# Patient Record
Sex: Female | Born: 1935 | Race: White | Hispanic: No | Marital: Married | Smoking: Former smoker
Health system: Southern US, Community
[De-identification: ages and names within clinical notes are randomized; demographics above are authoritative.]

## PROBLEM LIST (undated history)

## (undated) DIAGNOSIS — E785 Hyperlipidemia, unspecified: Secondary | ICD-10-CM

## (undated) DIAGNOSIS — F0390 Unspecified dementia without behavioral disturbance: Secondary | ICD-10-CM

## (undated) DIAGNOSIS — IMO0001 Reserved for inherently not codable concepts without codable children: Secondary | ICD-10-CM

## (undated) DIAGNOSIS — F329 Major depressive disorder, single episode, unspecified: Secondary | ICD-10-CM

## (undated) DIAGNOSIS — F32A Depression, unspecified: Secondary | ICD-10-CM

## (undated) DIAGNOSIS — R002 Palpitations: Secondary | ICD-10-CM

## (undated) DIAGNOSIS — I10 Essential (primary) hypertension: Secondary | ICD-10-CM

## (undated) DIAGNOSIS — E7800 Pure hypercholesterolemia, unspecified: Secondary | ICD-10-CM

## (undated) DIAGNOSIS — R413 Other amnesia: Secondary | ICD-10-CM

## (undated) DIAGNOSIS — R51 Headache: Secondary | ICD-10-CM

## (undated) HISTORY — DX: Pure hypercholesterolemia, unspecified: E78.00

## (undated) HISTORY — DX: Major depressive disorder, single episode, unspecified: F32.9

## (undated) HISTORY — DX: Reserved for inherently not codable concepts without codable children: IMO0001

## (undated) HISTORY — DX: Palpitations: R00.2

## (undated) HISTORY — DX: Other amnesia: R41.3

## (undated) HISTORY — DX: Headache: R51

## (undated) HISTORY — DX: Hyperlipidemia, unspecified: E78.5

## (undated) HISTORY — DX: Unspecified dementia, unspecified severity, without behavioral disturbance, psychotic disturbance, mood disturbance, and anxiety: F03.90

## (undated) HISTORY — DX: Depression, unspecified: F32.A

## (undated) HISTORY — DX: Essential (primary) hypertension: I10

## (undated) HISTORY — PX: OTHER SURGICAL HISTORY: SHX169

## (undated) HISTORY — PX: SKIN LESION EXCISION: SHX2412

## (undated) HISTORY — PX: ABDOMINAL HYSTERECTOMY: SHX81

---

## 1998-10-14 ENCOUNTER — Ambulatory Visit (HOSPITAL_COMMUNITY): Admission: RE | Admit: 1998-10-14 | Discharge: 1998-10-14 | Payer: Self-pay | Admitting: Obstetrics & Gynecology

## 1998-11-03 ENCOUNTER — Other Ambulatory Visit: Admission: RE | Admit: 1998-11-03 | Discharge: 1998-11-03 | Payer: Self-pay | Admitting: Plastic Surgery

## 2003-08-07 DIAGNOSIS — IMO0001 Reserved for inherently not codable concepts without codable children: Secondary | ICD-10-CM

## 2003-08-07 HISTORY — DX: Reserved for inherently not codable concepts without codable children: IMO0001

## 2006-03-08 ENCOUNTER — Encounter: Admission: RE | Admit: 2006-03-08 | Discharge: 2006-03-08 | Payer: Self-pay | Admitting: Cardiology

## 2006-06-25 ENCOUNTER — Encounter: Admission: RE | Admit: 2006-06-25 | Discharge: 2006-06-25 | Payer: Self-pay | Admitting: Orthopedic Surgery

## 2007-07-21 ENCOUNTER — Encounter: Admission: RE | Admit: 2007-07-21 | Discharge: 2007-08-06 | Payer: Self-pay | Admitting: Family Medicine

## 2007-08-11 ENCOUNTER — Encounter: Admission: RE | Admit: 2007-08-11 | Discharge: 2007-09-15 | Payer: Self-pay | Admitting: Family Medicine

## 2007-09-24 ENCOUNTER — Encounter: Admission: RE | Admit: 2007-09-24 | Discharge: 2007-11-24 | Payer: Self-pay | Admitting: Orthopedic Surgery

## 2008-03-08 ENCOUNTER — Encounter: Admission: RE | Admit: 2008-03-08 | Discharge: 2008-03-18 | Payer: Self-pay | Admitting: Orthopedic Surgery

## 2009-06-26 ENCOUNTER — Encounter: Admission: RE | Admit: 2009-06-26 | Discharge: 2009-06-26 | Payer: Self-pay | Admitting: Neurology

## 2010-04-13 ENCOUNTER — Ambulatory Visit: Payer: Self-pay | Admitting: Cardiology

## 2010-04-13 ENCOUNTER — Encounter: Admission: RE | Admit: 2010-04-13 | Discharge: 2010-04-13 | Payer: Self-pay | Admitting: Cardiology

## 2010-04-14 ENCOUNTER — Encounter: Admission: RE | Admit: 2010-04-14 | Discharge: 2010-04-14 | Payer: Self-pay | Admitting: Cardiology

## 2010-06-26 ENCOUNTER — Ambulatory Visit: Payer: Self-pay | Admitting: Cardiology

## 2010-06-27 ENCOUNTER — Ambulatory Visit: Payer: Self-pay | Admitting: Cardiology

## 2010-08-30 ENCOUNTER — Encounter: Payer: Self-pay | Admitting: Cardiology

## 2010-08-30 DIAGNOSIS — F0390 Unspecified dementia without behavioral disturbance: Secondary | ICD-10-CM | POA: Insufficient documentation

## 2010-08-30 DIAGNOSIS — I10 Essential (primary) hypertension: Secondary | ICD-10-CM | POA: Insufficient documentation

## 2010-08-30 DIAGNOSIS — E7800 Pure hypercholesterolemia, unspecified: Secondary | ICD-10-CM | POA: Insufficient documentation

## 2010-08-30 DIAGNOSIS — E785 Hyperlipidemia, unspecified: Secondary | ICD-10-CM | POA: Insufficient documentation

## 2010-10-26 ENCOUNTER — Ambulatory Visit (INDEPENDENT_AMBULATORY_CARE_PROVIDER_SITE_OTHER): Payer: Medicare Other | Admitting: Cardiology

## 2010-10-26 ENCOUNTER — Encounter: Payer: Self-pay | Admitting: Cardiology

## 2010-10-26 VITALS — BP 136/64 | HR 64 | Wt 176.0 lb

## 2010-10-26 DIAGNOSIS — E785 Hyperlipidemia, unspecified: Secondary | ICD-10-CM

## 2010-10-26 DIAGNOSIS — I119 Hypertensive heart disease without heart failure: Secondary | ICD-10-CM

## 2010-10-26 DIAGNOSIS — I10 Essential (primary) hypertension: Secondary | ICD-10-CM

## 2010-10-26 DIAGNOSIS — F0390 Unspecified dementia without behavioral disturbance: Secondary | ICD-10-CM

## 2010-10-26 DIAGNOSIS — E7800 Pure hypercholesterolemia, unspecified: Secondary | ICD-10-CM

## 2010-10-26 LAB — LIPID PANEL
Cholesterol: 199 mg/dL (ref 0–200)
HDL: 79 mg/dL (ref >39)
LDL Cholesterol: 101 mg/dL — ABNORMAL HIGH (ref 0–99)
VLDL: 19 mg/dL (ref 0–40)

## 2010-10-26 LAB — COMPREHENSIVE METABOLIC PANEL
ALT: 12 U/L (ref 0–35)
AST: 18 U/L (ref 0–37)
Albumin: 4.2 g/dL (ref 3.5–5.2)
Calcium: 9.1 mg/dL (ref 8.4–10.5)
Chloride: 96 mEq/L (ref 96–112)
Total Protein: 6.7 g/dL (ref 6.0–8.3)

## 2010-10-26 NOTE — Assessment & Plan Note (Signed)
No new medications prescribed today.  She will continue her regular followup appointments with her neurologist.  We will not restart her propranolol at this time.

## 2010-10-26 NOTE — Progress Notes (Signed)
HPI: Frequent headaches.  Especially if hungry.  Memory possibly better.  Sleeping better with ambien. Thinks she may be having low bloodsugar at times.Sometimes 30 minutes after meals she will notice a transitory pounding in the left side of her head.  She is unable to state whether this is worse and she is no longer on the propranolol.  Her neurologist took her off the propranolol possibly because of her history of dementia.  She has not been experiencing any exertional chest pain or angina.  She is continuing to exercise regularly to try to lose weight.  Current Outpatient Prescriptions  Medication Sig Dispense Refill  . Ascorbic Acid (VITAMIN C) 500 MG tablet Take 500 mg by mouth daily.        [name] aspirin 325 MG tablet Take 325 mg by mouth daily.        [name] atorvastatin (LIPITOR) 20 MG tablet Take 20 mg by mouth daily.        [name] b complex vitamins tablet Take 1 tablet by mouth daily.        . Bumetanide (BUMEX PO) Take 1 mg by mouth daily. 1/2 DAILY       . candesartan-hydrochlorothiazide (ATACAND HCT) 32-12.5 MG per tablet Take 1 tablet by mouth daily.        . Cholecalciferol (VITAMIN D) 2000 UNIT tablet Take 2,000 Units by mouth daily.        [name] donepezil (ARICEPT) 10 MG tablet Take 10 mg by mouth daily.        [name] gabapentin (NEURONTIN) 300 MG capsule Take 300 mg by mouth at bedtime.        . MULTIPLE VITAMIN PO Take by mouth daily.        [name] olmesartan-hydrochlorothiazide (BENICAR HCT) 40-12.5 MG per tablet Take 1 tablet by mouth daily.        . vitamin A 66063 UNIT capsule Take 10,000 Units by mouth daily.        [name] zolpidem (AMBIEN) 10 MG tablet Take 10 mg by mouth at bedtime.        . propranolol (INDERAL) 20 MG tablet Take 20 mg by mouth as needed.          Allergies  Allergen Reactions  . Mevacor (Lovastatin)     Leg cramps  . Toprol Xl (Metoprolol Succinate) Palpitations    Patient Active Problem List  Diagnoses  . Hypertension  . Hypercholesteremia  . Headache  . Dyslipidemia    . Dementia    History  Smoking status  . Former Smoker  Smokeless tobacco  . Not on file    History  Alcohol Use  . 0.0 oz/week  . 1-3 Glasses of wine per week    Family History  Problem Relation Age of Onset  . Parkinsonism Mother   . Heart disease Father     Review of Systems: The patient denies any heat or cold intolerance.  No weight gain or weight loss.  The patient denies headaches or blurry vision.  There is no cough or sputum production.  The patient denies dizziness.  There is no hematuria or hematochezia.  The patient denies any muscle aches or arthritis.  The patient denies any rash.  The patient denies frequent falling or instability. All other systems were reviewed and are negative.   Physical Exam: Vital signs as recorded.  Blood pressure is satisfactory.  The general appearance reveals a well-developed well-nourished woman in no acute distress.Pupils equal and reactive.   Extraocular Movements  are full.  There is no scleral icterus.  The mouth and pharynx are normal.  The neck is supple.  The carotids reveal no bruits.  The jugular venous pressure is normal.  The thyroid is not enlarged.  There is no lymphadenopathy.The chest is clear to percussion and auscultation. There are no rales or rhonchi. Expansion of the chest is symmetrical.The precordium is quiet.  The first heart sound is normal.  The second heart sound is physiologically split.  There is no murmur gallop rub or click.  There is no abnormal lift or heave.The abdomen is soft and nontender. Bowel sounds are normal. The liver and spleen are not enlarged. There Are no abdominal masses. There are no bruits.The pedal pulses are good.  There is no phlebitis or edema.  There is no cyanosis or clubbing.Strength is normal and symmetrical in all extremities.  There is no lateralizing weakness.  There are no sensory deficits.The skin is warm and dry.  There is no rash.    Assessment / Plan:

## 2010-10-26 NOTE — Assessment & Plan Note (Signed)
The patient is trying to adhere to a careful low-cholesterol diet.  To avoid hypoglycemia she will avoid free sweets and to try to maintain a high routine diet intake.Fasting lab work today is pending.

## 2010-10-26 NOTE — Assessment & Plan Note (Signed)
Her blood pressure remains satisfactory on present medication.  Continue same meds.

## 2010-11-06 ENCOUNTER — Encounter: Payer: Self-pay | Admitting: *Deleted

## 2010-12-06 ENCOUNTER — Telehealth: Payer: Self-pay | Admitting: Cardiology

## 2010-12-06 NOTE — Telephone Encounter (Signed)
Wakes up in the middle of the night with the shakes and thinks it is coming from low blood sugar.  States she has had episodes for years. States her symptoms for last 3 days are weak trembles, heart fluttering, no energy, and short of breath, which she says is like her low blood sugar episodes.  Thinks it is all related to low blood sugar has discussed in the past. Wants a glucose pill called to pharmacy.  Insisting that low blood sugar is the problem and that the pharmacist says there is a glucose pill.  Please advise.

## 2010-12-06 NOTE — Telephone Encounter (Signed)
I don't think that these are description drugs.  There may be an over-the-counter glucose pills that the pharmacy would be able to advise [place] about.  She can probably accomplish the same thing by drinking orange juice with sugar.

## 2010-12-06 NOTE — Telephone Encounter (Signed)
Wants to ask you a question about her "low blood sugar" and what kind of med.she should take for it.

## 2010-12-07 NOTE — Telephone Encounter (Signed)
Work in with Lawson Fiscal per [name]

## 2010-12-08 ENCOUNTER — Ambulatory Visit (INDEPENDENT_AMBULATORY_CARE_PROVIDER_SITE_OTHER): Payer: Medicare Other | Admitting: Nurse Practitioner

## 2010-12-08 ENCOUNTER — Encounter: Payer: Self-pay | Admitting: Nurse Practitioner

## 2010-12-08 ENCOUNTER — Telehealth: Payer: Self-pay | Admitting: *Deleted

## 2010-12-08 VITALS — BP 142/82 | HR 66 | Ht 64.0 in | Wt 175.4 lb

## 2010-12-08 DIAGNOSIS — R51 Headache: Secondary | ICD-10-CM

## 2010-12-08 DIAGNOSIS — F0390 Unspecified dementia without behavioral disturbance: Secondary | ICD-10-CM

## 2010-12-08 DIAGNOSIS — I10 Essential (primary) hypertension: Secondary | ICD-10-CM

## 2010-12-08 DIAGNOSIS — R002 Palpitations: Secondary | ICD-10-CM

## 2010-12-08 LAB — BASIC METABOLIC PANEL
BUN: 8 mg/dL (ref 6–23)
CO2: 30 mEq/L (ref 19–32)
Calcium: 9.1 mg/dL (ref 8.4–10.5)
Chloride: 100 mEq/L (ref 96–112)
Creatinine, Ser: 0.5 mg/dL (ref 0.4–1.2)
GFR: 127.04 mL/min (ref >60.00)
Glucose, Bld: 84 mg/dL (ref 70–99)
Potassium: 4.2 mEq/L (ref 3.5–5.1)
Sodium: 138 mEq/L (ref 135–145)

## 2010-12-08 LAB — TSH: TSH: 0.54 u[IU]/mL (ref 0.35–5.50)

## 2010-12-08 MED ORDER — PROPRANOLOL HCL 20 MG PO TABS: 20.0000 mg | ORAL_TABLET | Freq: Two times a day (BID) | ORAL | Status: DC

## 2010-12-08 NOTE — Assessment & Plan Note (Signed)
I would like to get her back on her Inderal. I have sent a new prescription to the drug store for the 20mg  to take BID. I will see her back in about 1 month. Her husband will monitor her blood pressure. Patient and husband are agreeable to this plan and will call if any problems develop in the interim.

## 2010-12-08 NOTE — Telephone Encounter (Signed)
Adv pt of lab results re- voicemail

## 2010-12-08 NOTE — Assessment & Plan Note (Signed)
Inderal has been added back. This may help.

## 2010-12-08 NOTE — Progress Notes (Signed)
Mary Harrison Date of Birth: 1931/02/24   History of Present Illness: Mary Harrison is seen today for a work in visit. She is seen for [name]. She is complaining of headaches, palpitations and weak spells. Yesterday was a "bad day". Most of the history is obtained from the husband. She has significant memory issues. She is not having chest pain. Her blood pressure is running higher at home. She is having palpitations. She can feel her heart pulsating in her throat. She is not dizzy. She is not having syncope. She is no longer taking her Inderal. Apparently Dr. Sandria Manly did not want to refill it. She did try a dose last night and had good results. She has not been taking this regularly for quite some time.   Current Outpatient Prescriptions on File Prior to Visit  Medication Sig Dispense Refill  . Ascorbic Acid (VITAMIN C) 500 MG tablet Take 500 mg by mouth daily.        [name] aspirin 325 MG tablet Take 325 mg by mouth daily.        [name] atorvastatin (LIPITOR) 20 MG tablet Take 20 mg by mouth daily.        [name] b complex vitamins tablet Take 1 tablet by mouth daily.        . Bumetanide (BUMEX PO) Take 1 mg by mouth daily. 1/2 DAILY       . Cholecalciferol (VITAMIN D) 2000 UNIT tablet Take 2,000 Units by mouth daily.        [name] donepezil (ARICEPT) 10 MG tablet Take 10 mg by mouth daily.        [name] gabapentin (NEURONTIN) 300 MG capsule Take 300 mg by mouth at bedtime.        . MULTIPLE VITAMIN PO Take by mouth daily.        [name] olmesartan-hydrochlorothiazide (BENICAR HCT) 40-12.5 MG per tablet Take 1 tablet by mouth daily.        [name] omeprazole (PRILOSEC) 40 MG capsule Take 40 mg by mouth daily.        . propranolol (INDERAL) 20 MG tablet Take 20 mg by mouth as needed.        . vitamin A 87564 UNIT capsule Take 10,000 Units by mouth daily.        [name] zolpidem (AMBIEN) 10 MG tablet Take 10 mg by mouth at bedtime as needed.       . candesartan-hydrochlorothiazide (ATACAND HCT) 32-12.5 MG per tablet Take 1 tablet by  mouth daily.          Allergies  Allergen Reactions  . Mevacor (Lovastatin)     Leg cramps  . Toprol Xl (Metoprolol Succinate) Palpitations    Past Medical History  Diagnosis Date  . Hypertension   . Hypercholesteremia   . Headache   . Dyslipidemia   . Dementia     Past Surgical History  Procedure Date  . Abdominal hysterectomy     History  Smoking status  . Former Smoker  . Quit date: 08/06/1968  Smokeless tobacco  . Never Used    History  Alcohol Use  . 0.0 oz/week  . 1-3 Glasses of wine per week    Family History  Problem Relation Age of Onset  . Parkinsonism Mother   . Heart disease Father     Review of Systems: The review of systems is positive for memory issues. She is dizzy and having headaches. Eating does not make these symptoms go away. She does not use  much caffeine.  All other systems were reviewed and are negative.  Physical Exam: BP 142/82  Pulse 66  Ht 5\' 4"  (1.626 m)  Wt 175 lb 6.4 oz (79.561 kg)  BMI 30.11 kg/m2 Patient is very pleasant and in no acute distress. She does have significant memory issues. She is obese. Skin is warm and dry. Color is normal.  HEENT is unremarkable. Normocephalic/atraumatic. PERRL. Sclera are nonicteric. Neck is supple. No masses. No JVD. Lungs are clear. Cardiac exam shows a regular rate and rhythm. She does have an occasional ectopic noted but in sinus rhythm. Abdomen is soft and obese. Extremities are without edema. Gait and ROM are intact. No gross neurologic deficits noted.  LABORATORY DATA:   PENDING   Assessment / Plan:'

## 2010-12-08 NOTE — Assessment & Plan Note (Signed)
Inderal is added back. This may help with her headaches.

## 2010-12-08 NOTE — Patient Instructions (Signed)
We are going to get you back on the Inderal. Take 20mg  2 x a day. I have sent a prescription to the drug store. Check your blood pressure and keep a diary. I will see her back in about 1 month We will check your thyroid levels today.

## 2010-12-08 NOTE — Assessment & Plan Note (Signed)
She is followed by Dr. Sandria Manly. She sees him on Monday for evaluation. She is on Aricept.

## 2010-12-12 ENCOUNTER — Encounter: Payer: Self-pay | Admitting: Cardiology

## 2011-01-05 ENCOUNTER — Ambulatory Visit: Payer: Medicare Other | Admitting: Nurse Practitioner

## 2011-02-22 ENCOUNTER — Other Ambulatory Visit: Payer: Self-pay | Admitting: Cardiology

## 2011-02-22 DIAGNOSIS — E785 Hyperlipidemia, unspecified: Secondary | ICD-10-CM

## 2011-02-22 NOTE — Telephone Encounter (Signed)
escribe request  

## 2011-03-01 ENCOUNTER — Other Ambulatory Visit: Payer: Self-pay | Admitting: Cardiology

## 2011-03-01 DIAGNOSIS — G629 Polyneuropathy, unspecified: Secondary | ICD-10-CM

## 2011-03-02 ENCOUNTER — Encounter: Payer: Self-pay | Admitting: Cardiology

## 2011-03-05 ENCOUNTER — Encounter: Payer: Self-pay | Admitting: Cardiology

## 2011-03-05 ENCOUNTER — Ambulatory Visit (INDEPENDENT_AMBULATORY_CARE_PROVIDER_SITE_OTHER): Payer: Medicare Other | Admitting: Cardiology

## 2011-03-05 VITALS — BP 150/80 | HR 60 | Wt 173.0 lb

## 2011-03-05 DIAGNOSIS — E785 Hyperlipidemia, unspecified: Secondary | ICD-10-CM

## 2011-03-05 DIAGNOSIS — I119 Hypertensive heart disease without heart failure: Secondary | ICD-10-CM

## 2011-03-05 DIAGNOSIS — R002 Palpitations: Secondary | ICD-10-CM

## 2011-03-05 DIAGNOSIS — I10 Essential (primary) hypertension: Secondary | ICD-10-CM

## 2011-03-05 MED ORDER — AMLODIPINE BESYLATE 5 MG PO TABS: 5.0000 mg | ORAL_TABLET | Freq: Every day | ORAL | Status: DC

## 2011-03-05 NOTE — Assessment & Plan Note (Addendum)
The patient has a past history of elevated cholesterol.  She is trying to maintain a low-cholesterol diet.She presently is taking 10 mg of atorvastatin and is tolerating it without myalgias or other side effects from statins.

## 2011-03-05 NOTE — Assessment & Plan Note (Signed)
The patient has not been aware of any recent palpitations and the propranolol appears to be helping in that regard

## 2011-03-05 NOTE — Progress Notes (Signed)
Mary Harrison Date of Birth:  1931-02-26 [facility] / [facility] [address]   [address] [place], [place]  [address] [phone]           Fax   [phone]  History of Present Illness: This pleasant 77 year old woman is seen for a scheduled followup office visit she has a history of essential hypertension and a history of hypercholesterolemia.  In addition she's had some problems with early dementia and is followed by Dr. Sandria Manly.Recently she's been experiencing a lot of headaches most days.  She's also noted some mild sensation of dizziness associated with the headache.  She just does not feel well.  Her appetite is decreased and she has lost 2 pounds since last visit.She has not been expressing any chest pain or angina.  He does not have any history of ischemic heart disease and she had a normal Cardiolite stress test 09/23/03.  Current Outpatient Prescriptions  Medication Sig Dispense Refill  . Ascorbic Acid (VITAMIN C) 500 MG tablet Take 500 mg by mouth daily.        [name] aspirin 325 MG tablet Take 325 mg by mouth daily.        [name] atorvastatin (LIPITOR) 20 MG tablet TAKE 1/2 TABLET BY MOUTH EVERY DAY  15 tablet  PRN  . b complex vitamins tablet Take 1 tablet by mouth daily.        . Bumetanide (BUMEX PO) Take 1 mg by mouth daily. 1/2 DAILY       . Cholecalciferol (VITAMIN D) 2000 UNIT tablet Take 2,000 Units by mouth daily.        [name] donepezil (ARICEPT) 10 MG tablet Take 10 mg by mouth daily.        [name] gabapentin (NEURONTIN) 300 MG capsule TAKE 1 OR 2 CAPSULES BY MOUTH AT BEDTIME  180 capsule  0  . olmesartan-hydrochlorothiazide (BENICAR HCT) 40-12.5 MG per tablet Take 1 tablet by mouth daily.        [name] omeprazole (PRILOSEC) 40 MG capsule Take 40 mg by mouth daily.        . propranolol (INDERAL) 20 MG tablet Take 1 tablet (20 mg total) by mouth 2 (two) times daily.  60 tablet  11  . zolpidem (AMBIEN) 10 MG tablet Take 10 mg by mouth at bedtime as needed. Taking 1/4      .  amLODipine (NORVASC) 5 MG tablet Take 1 tablet (5 mg total) by mouth daily.  30 tablet  11    Allergies  Allergen Reactions  . Mevacor (Lovastatin)     Leg cramps  . Toprol Xl (Metoprolol Succinate) Palpitations    Patient Active Problem List  Diagnoses  . Hypertension  . Hypercholesteremia  . Headache  . Dyslipidemia  . Dementia  . Palpitation    History  Smoking status  . Former Smoker  . Quit date: 08/06/1968  Smokeless tobacco  . Never Used    History  Alcohol Use  . 0.0 oz/week  . 1-3 Glasses of wine per week    Family History  Problem Relation Age of Onset  . Parkinsonism Mother   . Heart disease Father     Review of Systems: Constitutional: no fever chills diaphoresis or fatigue or change in weight.  Head and neck: no hearing loss, no epistaxis, no photophobia or visual disturbance. Respiratory: No cough, shortness of breath or wheezing. Cardiovascular: No chest pain peripheral edema, palpitations. Gastrointestinal: No abdominal distention, no abdominal pain, no change in bowel  habits hematochezia or melena. Genitourinary: No dysuria, no frequency, no urgency, no nocturia. Musculoskeletal:No arthralgias, no back pain, no gait disturbance or myalgias. Neurological: No dizziness,  no numbness, no seizures, no syncope, no weakness, no tremors.She experiences headaches about 8 days out of 10 Hematologic: No lymphadenopathy, no easy bruising. Psychiatric: No confusion, no hallucinations, no sleep disturbance.    Physical Exam: Filed Vitals:   03/05/11 1346  BP: 150/80  Pulse: 60  The general appearance reveals a well-developed well-nourished woman in no distress.Pupils equal and reactive.   Extraocular Movements are full.  There is no scleral icterus.  The mouth and pharynx are normal.  The neck is supple.  The carotids reveal no bruits.  The jugular venous pressure is normal.  The thyroid is not enlarged.  There is no lymphadenopathy.Funduscopic exam  reveals no hemorrhages or exudates.  The chest is clear to percussion and auscultation. There are no rales or rhonchi. Expansion of the chest is symmetrical.  The precordium is quiet.  The first heart sound is normal.  The second heart sound is physiologically split.  There is no murmur gallop rub or click.  There is no abnormal lift or heave.  The abdomen is soft and nontender. Bowel sounds are normal. The liver and spleen are not enlarged. There Are no abdominal masses. There are no bruits.  The pedal pulses are good.  There is no phlebitis or edema.  There is no cyanosis or clubbing.  Strength is normal and symmetrical in all extremities.  There is no lateralizing weakness.  There are no sensory deficits.Deep tendon reflexes are normal and symmetrical The skin is warm and dry.  There is no rash.      Assessment / Plan: Her systolic blood pressure is high today and we are going to add low-dose amlodipine 5 mg one daily and see if reducing her systolic blood pressure may have a beneficial effect on her headaches.  We will have her return to see Lawson Fiscal in 3 weeks.

## 2011-03-05 NOTE — Assessment & Plan Note (Addendum)
The patient has a past history of essential hypertension.  Recently she has been experiencing headaches and a feeling of an aching in her hand on both sides of her head which occurs about 8 days out of 10.  Usually over-the-counter medication will help the headache partially but not totally.  She has a past history of having seen a neurologist Dr. Sandria Manly several years ago and she had an MRI at that time because of some concerns of dementia.  The patient continues to have some problems with her memory but apparently her symptoms have not progressed much over the past year.  In terms of her blood pressure medication she previously was on Atacand HCT and is now on Benicar HCT and in addition she is on propranolol

## 2011-03-09 ENCOUNTER — Other Ambulatory Visit: Payer: Self-pay | Admitting: Cardiology

## 2011-03-09 NOTE — Telephone Encounter (Signed)
escribe medication per fax request  

## 2011-03-23 ENCOUNTER — Ambulatory Visit (INDEPENDENT_AMBULATORY_CARE_PROVIDER_SITE_OTHER): Payer: Medicare Other | Admitting: Nurse Practitioner

## 2011-03-23 ENCOUNTER — Encounter: Payer: Self-pay | Admitting: Nurse Practitioner

## 2011-03-23 VITALS — BP 126/78 | HR 76 | Ht 64.0 in | Wt 175.0 lb

## 2011-03-23 DIAGNOSIS — R002 Palpitations: Secondary | ICD-10-CM

## 2011-03-23 DIAGNOSIS — I10 Essential (primary) hypertension: Secondary | ICD-10-CM

## 2011-03-23 DIAGNOSIS — R519 Headache, unspecified: Secondary | ICD-10-CM

## 2011-03-23 DIAGNOSIS — F0390 Unspecified dementia without behavioral disturbance: Secondary | ICD-10-CM

## 2011-03-23 DIAGNOSIS — R51 Headache: Secondary | ICD-10-CM

## 2011-03-23 NOTE — Assessment & Plan Note (Signed)
Seems to be her most limiting factor to me.

## 2011-03-23 NOTE — Assessment & Plan Note (Signed)
Blood pressure seems ok here today. I have left her on her current regimen. I have encouraged her husband to monitor her blood pressure and keep a diary to be reviewed. Her headaches may be better, it is hard to tell. I will see her back in about 6 weeks.

## 2011-03-23 NOTE — Progress Notes (Signed)
Mary Harrison Date of Birth: 1930-08-11   History of Present Illness: Mary Harrison is seen back today for a 3 week check. She is seen for [name]. She has had Norvasc added for her blood pressure and to see if helped with her headaches. She is here with her husband. He provides most of the history. She is really not sure why she is here. She has dementia. Her husband thinks she may be some better. She has had a chronic complaint of headache. He is not sure if she is really taking her medicines. She gets very argumentative when discussing taking her medicines. No chest pain. He say she will have palpitations at night.   Current Outpatient Prescriptions on File Prior to Visit  Medication Sig Dispense Refill  . Ascorbic Acid (VITAMIN C) 500 MG tablet Take 500 mg by mouth daily.        [name] aspirin 325 MG tablet Take 325 mg by mouth daily.        [name] atorvastatin (LIPITOR) 20 MG tablet TAKE 1/2 TABLET BY MOUTH EVERY DAY  15 tablet  PRN  . b complex vitamins tablet Take 1 tablet by mouth daily.        . Bumetanide (BUMEX PO) Take 1 mg by mouth daily. 1/2 DAILY       . Cholecalciferol (VITAMIN D) 2000 UNIT tablet Take 2,000 Units by mouth daily.        [name] donepezil (ARICEPT) 10 MG tablet Take 10 mg by mouth daily.        [name] gabapentin (NEURONTIN) 300 MG capsule TAKE 1 OR 2 CAPSULES BY MOUTH AT BEDTIME  180 capsule  0  . olmesartan-hydrochlorothiazide (BENICAR HCT) 40-12.5 MG per tablet Take 1 tablet by mouth daily.        [name] omeprazole (PRILOSEC) 40 MG capsule Take 40 mg by mouth daily.        . propranolol (INDERAL) 20 MG tablet Take 1 tablet (20 mg total) by mouth 2 (two) times daily.  60 tablet  11  . zolpidem (AMBIEN) 10 MG tablet TAKE ONE TABLET BY MOUTH AT BEDTIME AS NEEDED FOR SLEEP  100 tablet  3  . amLODipine (NORVASC) 5 MG tablet Take 1 tablet (5 mg total) by mouth daily.  30 tablet  11    Allergies  Allergen Reactions  . Mevacor (Lovastatin)     Leg cramps  . Toprol Xl (Metoprolol  Succinate) Palpitations    Past Medical History  Diagnosis Date  . Hypertension   . Hypercholesteremia   . Headache   . Dyslipidemia   . Dementia   . Migraine   . Palpitation   . Normal nuclear stress test 2005    Past Surgical History  Procedure Date  . Abdominal hysterectomy   . Colon repair   . Adhesions removed from colon     History  Smoking status  . Former Smoker  . Quit date: 08/06/1968  Smokeless tobacco  . Never Used    History  Alcohol Use  . 0.0 oz/week  . 1-3 Glasses of wine per week    Family History  Problem Relation Age of Onset  . Parkinsonism Mother   . Heart disease Father     Review of Systems: The review of systems is positive for poor memory. She has chronic headache. She denies chest pain.  All other systems were reviewed and are negative.  Physical Exam: BP 126/78  Pulse 76  Ht 5\' 4"  (1.626  m)  Wt 175 lb (79.379 kg)  BMI 30.04 kg/m2 Patient is very pleasant and in no acute distress. She is a poor historian and answers "I don't know" to most questions. Skin is warm and dry. Color is normal.  HEENT is unremarkable. Normocephalic/atraumatic. PERRL. Sclera are nonicteric. Neck is supple. No masses. No JVD. Lungs are clear. Cardiac exam shows a regular rate and rhythm. Abdomen is soft. Extremities are without edema. Gait and ROM are intact. No gross neurologic deficits noted.  LABORATORY DATA:   Assessment / Plan:

## 2011-03-23 NOTE — Assessment & Plan Note (Signed)
I doubt she is taking the propranolol as prescribed. Her husband will try to oversee her pill box.

## 2011-03-23 NOTE — Patient Instructions (Addendum)
Monitor your blood pressure at home, especially when you are having these "sore" headaches Take the propranolol two times a day I will see you in 6 weeks. Call for any problems.

## 2011-03-23 NOTE — Assessment & Plan Note (Signed)
This may be better. Her history is hard to extract. Her husband thinks she is a little better.

## 2011-04-16 ENCOUNTER — Other Ambulatory Visit: Payer: Self-pay | Admitting: Cardiology

## 2011-04-16 NOTE — Telephone Encounter (Signed)
Refilled bumetanide

## 2011-04-30 ENCOUNTER — Other Ambulatory Visit: Payer: Self-pay | Admitting: Cardiology

## 2011-04-30 NOTE — Telephone Encounter (Signed)
Refilled benicar 

## 2011-05-04 ENCOUNTER — Ambulatory Visit (INDEPENDENT_AMBULATORY_CARE_PROVIDER_SITE_OTHER): Payer: Medicare Other | Admitting: Nurse Practitioner

## 2011-05-04 ENCOUNTER — Encounter: Payer: Self-pay | Admitting: Nurse Practitioner

## 2011-05-04 DIAGNOSIS — R002 Palpitations: Secondary | ICD-10-CM

## 2011-05-04 DIAGNOSIS — R531 Weakness: Secondary | ICD-10-CM

## 2011-05-04 DIAGNOSIS — F0390 Unspecified dementia without behavioral disturbance: Secondary | ICD-10-CM

## 2011-05-04 DIAGNOSIS — R5381 Other malaise: Secondary | ICD-10-CM

## 2011-05-04 DIAGNOSIS — I10 Essential (primary) hypertension: Secondary | ICD-10-CM

## 2011-05-04 NOTE — Progress Notes (Signed)
Mary Harrison Date of Birth: 12/24/1930   History of Present Illness: Mary Harrison is seen back today for a 6 week visit. She is seen for [name]. She is here with her husband and he provides most of the history. She is taking her medicines. Doesn't seem to be having any current cardiac complaints. Her husband reports some "weak" spells and thinks it is due to low blood sugar. He is wanting to check some blood sugars at home. No chest pain. She has chronic headaches. She is pleasantly demented.   Current Outpatient Prescriptions on File Prior to Visit  Medication Sig Dispense Refill  . amLODipine (NORVASC) 5 MG tablet Take 1 tablet (5 mg total) by mouth daily.  30 tablet  11  . aspirin 325 MG tablet Take 325 mg by mouth daily. 1/2 tablet qd      . atorvastatin (LIPITOR) 20 MG tablet TAKE 1/2 TABLET BY MOUTH EVERY DAY  15 tablet  PRN  . b complex vitamins tablet Take 1 tablet by mouth daily.        [name] BENICAR HCT 40-12.5 MG per tablet TAKE 1 TABLET BY MOUTH EVERY DAY  30 tablet  PRN  . bumetanide (BUMEX) 1 MG tablet TAKE 1 TABLET BY MOUTH EVERY OTHER DAY AS NEEDED FOR FLUID  30 tablet  PRN  . Cholecalciferol (VITAMIN D) 2000 UNIT tablet Take 2,000 Units by mouth daily.        [name] donepezil (ARICEPT) 10 MG tablet Take 10 mg by mouth daily.        [name] gabapentin (NEURONTIN) 300 MG capsule TAKE 1 OR 2 CAPSULES BY MOUTH AT BEDTIME  180 capsule  0  . omeprazole (PRILOSEC) 40 MG capsule Take 40 mg by mouth daily.        . propranolol (INDERAL) 20 MG tablet Take 1 tablet (20 mg total) by mouth 2 (two) times daily.  60 tablet  11  . zolpidem (AMBIEN) 10 MG tablet TAKE ONE TABLET BY MOUTH AT BEDTIME AS NEEDED FOR SLEEP  100 tablet  3  . DISCONTD: Bumetanide (BUMEX PO) Take 1 mg by mouth daily. 1/2 DAILY         Allergies  Allergen Reactions  . Mevacor (Lovastatin)     Leg cramps  . Toprol Xl (Metoprolol Succinate) Palpitations    Past Medical History  Diagnosis Date  . Hypertension   .  Hypercholesteremia   . Headache   . Dyslipidemia   . Dementia   . Migraine   . Palpitation   . Normal nuclear stress test 2005    Past Surgical History  Procedure Date  . Abdominal hysterectomy   . Colon repair   . Adhesions removed from colon     History  Smoking status  . Former Smoker  . Quit date: 08/06/1968  Smokeless tobacco  . Never Used    History  Alcohol Use  . 0.0 oz/week  . 1-3 Glasses of wine per week    Family History  Problem Relation Age of Onset  . Parkinsonism Mother   . Heart disease Father     Review of Systems: The review of systems is per the HPI.  All other systems were reviewed and are negative.  Physical Exam: BP 138/72  Pulse 66  Ht 5' 4.5" (1.638 m)  Wt 173 lb 6.4 oz (78.654 kg)  BMI 29.30 kg/m2 Patient is very pleasant and in no acute distress. Skin is warm and dry. Color is normal.  HEENT is unremarkable. Normocephalic/atraumatic. PERRL. Sclera are nonicteric. Neck is supple. No masses. No JVD. Lungs are clear. Cardiac exam shows a regular rate and rhythm. Abdomen is obese but soft. Extremities are without edema. Gait and ROM are intact. No gross neurologic deficits noted.   LABORATORY DATA:   Assessment / Plan:

## 2011-05-04 NOTE — Patient Instructions (Signed)
Try to get a machine to check your blood sugars at home We will see you back in about 4 months Call for any problems

## 2011-05-04 NOTE — Assessment & Plan Note (Signed)
Blood pressure is ok. No change in her medicines.  

## 2011-05-04 NOTE — Assessment & Plan Note (Signed)
Doesn't seem bothered at this time.

## 2011-05-04 NOTE — Assessment & Plan Note (Signed)
Seems to be her most limiting factor.

## 2011-05-04 NOTE — Assessment & Plan Note (Signed)
This may be hypoglycemia. Her eating habits are poor. Her husband is going to get a blood sugar machine at the drug store and check some readings.

## 2011-05-14 ENCOUNTER — Other Ambulatory Visit: Payer: Self-pay | Admitting: Cardiology

## 2011-06-09 ENCOUNTER — Other Ambulatory Visit: Payer: Self-pay | Admitting: Cardiology

## 2011-06-11 NOTE — Telephone Encounter (Signed)
Refilled gabapentin

## 2011-08-10 ENCOUNTER — Ambulatory Visit (INDEPENDENT_AMBULATORY_CARE_PROVIDER_SITE_OTHER): Payer: Medicare Other | Admitting: Cardiology

## 2011-08-10 ENCOUNTER — Encounter: Payer: Self-pay | Admitting: Cardiology

## 2011-08-10 VITALS — BP 120/70 | HR 66 | Ht 64.0 in | Wt 171.0 lb

## 2011-08-10 DIAGNOSIS — I119 Hypertensive heart disease without heart failure: Secondary | ICD-10-CM

## 2011-08-10 DIAGNOSIS — R002 Palpitations: Secondary | ICD-10-CM

## 2011-08-10 DIAGNOSIS — E785 Hyperlipidemia, unspecified: Secondary | ICD-10-CM

## 2011-08-10 DIAGNOSIS — F0390 Unspecified dementia without behavioral disturbance: Secondary | ICD-10-CM

## 2011-08-10 MED ORDER — MEMANTINE HCL 28 X 5 MG & 21 X 10 MG PO TABS: ORAL_TABLET | ORAL | Status: DC

## 2011-08-10 NOTE — Assessment & Plan Note (Signed)
The patient is discouraged about her memory problems.  She is requesting a change in medication and she does not anticipate seeing her neurologist for quite some time.  We will give her a trial of Namenda titrate pack as directed and she will continue the Aricept.

## 2011-08-10 NOTE — Assessment & Plan Note (Signed)
The patient has not been aware of any recent palpitations or skipped beats.  She's not having any exertional chest pain or angina.

## 2011-08-10 NOTE — Progress Notes (Signed)
Mary Harrison Date of Birth:  [birth year] [facility] [address] [place], [place]  [address] [phone]         Fax   [phone]  History of Present Illness: This pleasant 78 year old woman is seen for a scheduled followup office visit.  Her husband came with her.  The patient has a history of some dementia.  She also has a history of hypercholesterolemia and essential hypertension.  She has seen Dr. love in the past but has not seen him recently.  She does not have any history of ischemic heart disease and she had a normal Cardiolite stress test in 2005.  Since last visit she's had no significant improvement on Aricept according to herself or her husband.  They are asking for a different medication to try.  She has been experiencing some episodes where she awakens feeling very jittery.  She feels as if her blood sugar is low but eating does not seem to make any difference.  I told her husband to check her blood pressure and her pulse the next time she has one of these episodes.  Current Outpatient Prescriptions  Medication Sig Dispense Refill  . amLODipine (NORVASC) 5 MG tablet Take 1 tablet (5 mg total) by mouth daily.  30 tablet  11  . aspirin 325 MG tablet Take 325 mg by mouth daily.       [name] atorvastatin (LIPITOR) 20 MG tablet TAKE 1/2 TABLET BY MOUTH EVERY DAY  15 tablet  PRN  . BENICAR HCT 40-12.5 MG per tablet TAKE 1 TABLET BY MOUTH EVERY DAY  30 tablet  PRN  . bumetanide (BUMEX) 1 MG tablet TAKE 1 TABLET BY MOUTH EVERY OTHER DAY AS NEEDED FOR FLUID  30 tablet  PRN  . Cholecalciferol (VITAMIN D) 2000 UNIT tablet Take 2,000 Units by mouth daily.        [name] donepezil (ARICEPT) 10 MG tablet Take 10 mg by mouth daily.        [name] gabapentin (NEURONTIN) 300 MG capsule TAKE 1 OR 2 CAPSULES BY MOUTH AT BEDTIME  60 capsule  3  . omeprazole (PRILOSEC) 40 MG capsule        . propranolol (INDERAL) 20 MG tablet Take 1 tablet (20 mg total) by mouth 2 (two) times daily.  60  tablet  11  . zolpidem (AMBIEN) 10 MG tablet TAKE ONE TABLET BY MOUTH AT BEDTIME AS NEEDED FOR SLEEP  100 tablet  3  . DISCONTD: omeprazole (PRILOSEC) 40 MG capsule TAKE ONE CAPSULE BY MOUTH DAILY  30 capsule  6  . memantine (NAMENDA TITRATION PAK) tablet pack 5 mg/day for =1 week; 5 mg twice daily for =1 week; 15 mg/day given in 5 mg and 10 mg separated doses for =1 week; then 10 mg twice daily  49 tablet  0    Allergies  Allergen Reactions  . Mevacor (Lovastatin)     Leg cramps  . Toprol Xl (Metoprolol Succinate) Palpitations    Patient Active Problem List  Diagnoses  . Hypertension  . Hypercholesteremia  . Headache  . Dyslipidemia  . Dementia  . Palpitation  . Weakness generalized    History  Smoking status  . Former Smoker  . Quit date: 08/06/1968  Smokeless tobacco  . Never Used    History  Alcohol Use  . 0.0 oz/week  . 1-3 Glasses of wine per week    Family History  Problem Relation Age of Onset  . Parkinsonism Mother   .  Heart disease Father     Review of Systems: Constitutional: no fever chills diaphoresis or fatigue or change in weight.  Head and neck: no hearing loss, no epistaxis, no photophobia or visual disturbance. Respiratory: No cough, shortness of breath or wheezing. Cardiovascular: No chest pain peripheral edema, palpitations. Gastrointestinal: No abdominal distention, no abdominal pain, no change in bowel habits hematochezia or melena. Genitourinary: No dysuria, no frequency, no urgency, no nocturia. Musculoskeletal:No arthralgias, no back pain, no gait disturbance or myalgias. Neurological: No dizziness, no headaches, no numbness, no seizures, no syncope, no weakness, no tremors. Hematologic: No lymphadenopathy, no easy bruising. Psychiatric: No confusion, no hallucinations, no sleep disturbance.    Physical Exam: Filed Vitals:   08/10/11 1510  BP: 120/70  Pulse: 66   The general appearance reveals a well-developed well-nourished  woman in no distress.The head and neck exam reveals pupils equal and reactive.  Extraocular movements are full.  There is no scleral icterus.  The mouth and pharynx are normal.  The neck is supple.  The carotids reveal no bruits.  The jugular venous pressure is normal.  The  thyroid is not enlarged.  There is no lymphadenopathy.  The chest is clear to percussion and auscultation.  There are no rales or rhonchi.  Expansion of the chest is symmetrical.  The precordium is quiet.  The first heart sound is normal.  The second heart sound is physiologically split.  There is no murmur gallop rub or click.  There is no abnormal lift or heave.  The abdomen is soft and nontender.  The bowel sounds are normal.  The liver and spleen are not enlarged.  There are no abdominal masses.  There are no abdominal bruits.  Extremities reveal good pedal pulses.  There is no phlebitis or edema.  There is no cyanosis or clubbing.  Strength is normal and symmetrical in all extremities.  There is no lateralizing weakness.  There are no sensory deficits.  The skin is warm and dry.  There is no rash.    Assessment / Plan: Trial of Namenda.  Recheck in 2 months for followup office visit EKG fasting lipid panel hepatic function panel basal metabolic panel and TSH level

## 2011-08-10 NOTE — Assessment & Plan Note (Signed)
The patient has a history of hypercholesterolemia and is on low-dose atorvastatin 10 mg daily.  She's not having any myalgias from the atorvastatin.  When she returns for her next office visit we will plan to get fasting lipids on her.

## 2011-08-10 NOTE — Patient Instructions (Signed)
Start on [place], Rx has been called to [facility].  When you are down to your last week of pills, call us and let us know how you are doing and we will call refills to your pharmacy.    Your physician recommends that you schedule a follow-up appointment in: 2 months with fasting labs (lp/bmet/hfp/tsh) and EKG

## 2011-10-16 ENCOUNTER — Encounter: Payer: Self-pay | Admitting: Cardiology

## 2011-10-16 ENCOUNTER — Other Ambulatory Visit: Payer: Medicare Other

## 2011-10-16 ENCOUNTER — Ambulatory Visit (INDEPENDENT_AMBULATORY_CARE_PROVIDER_SITE_OTHER): Payer: Medicare Other | Admitting: Cardiology

## 2011-10-16 VITALS — BP 140/80 | HR 61 | Ht 64.0 in | Wt 171.8 lb

## 2011-10-16 DIAGNOSIS — F0390 Unspecified dementia without behavioral disturbance: Secondary | ICD-10-CM

## 2011-10-16 DIAGNOSIS — E785 Hyperlipidemia, unspecified: Secondary | ICD-10-CM

## 2011-10-16 DIAGNOSIS — R5381 Other malaise: Secondary | ICD-10-CM

## 2011-10-16 DIAGNOSIS — I119 Hypertensive heart disease without heart failure: Secondary | ICD-10-CM

## 2011-10-16 DIAGNOSIS — I10 Essential (primary) hypertension: Secondary | ICD-10-CM

## 2011-10-16 DIAGNOSIS — E7800 Pure hypercholesterolemia, unspecified: Secondary | ICD-10-CM

## 2011-10-16 DIAGNOSIS — R531 Weakness: Secondary | ICD-10-CM

## 2011-10-16 LAB — BASIC METABOLIC PANEL
BUN: 8 mg/dL (ref 6–23)
Calcium: 9.4 mg/dL (ref 8.4–10.5)
Creatinine, Ser: 0.5 mg/dL (ref 0.4–1.2)
GFR: 126.76 mL/min (ref >60.00)
Glucose, Bld: 103 mg/dL — ABNORMAL HIGH (ref 70–99)

## 2011-10-16 LAB — CBC WITH DIFFERENTIAL/PLATELET
Basophils Relative: 0.6 % (ref 0.0–3.0)
Eosinophils Absolute: 0.1 10*3/uL (ref 0.0–0.7)
Eosinophils Relative: 2.1 % (ref 0.0–5.0)
HCT: 42.5 % (ref 36.0–46.0)
Lymphs Abs: 1.6 10*3/uL (ref 0.7–4.0)
MCHC: 33.7 g/dL (ref 30.0–36.0)
MCV: 90.7 fl (ref 78.0–100.0)
Monocytes Absolute: 0.7 10*3/uL (ref 0.1–1.0)
Neutro Abs: 3.7 10*3/uL (ref 1.4–7.7)
Neutrophils Relative %: 60.1 % (ref 43.0–77.0)
RBC: 4.69 Mil/uL (ref 3.87–5.11)
WBC: 6.2 10*3/uL (ref 4.5–10.5)

## 2011-10-16 LAB — HEPATIC FUNCTION PANEL
Albumin: 3.7 g/dL (ref 3.5–5.2)
Alkaline Phosphatase: 61 U/L (ref 39–117)
Total Protein: 6.7 g/dL (ref 6.0–8.3)

## 2011-10-16 LAB — LIPID PANEL
Cholesterol: 210 mg/dL — ABNORMAL HIGH (ref 0–200)
HDL: 76.8 mg/dL (ref >39.00)

## 2011-10-16 LAB — LDL CHOLESTEROL, DIRECT: Direct LDL: 103.7 mg/dL

## 2011-10-16 NOTE — Assessment & Plan Note (Signed)
Her blood pressure has been controlled on current therapy.  Her headaches have improved since she stopped the [employer]

## 2011-10-16 NOTE — Assessment & Plan Note (Signed)
The patient complains of lack of energy and feeling tired.  She is not doing any regular exercise.  I have encouraged her to try to go back to the gym for a regular exercise regimen again.  She used to do that.

## 2011-10-16 NOTE — Assessment & Plan Note (Signed)
The patient is on Lipitor for her hypercholesterolemia.  She is not having any side effects from the statin therapy.  Blood work today is pending

## 2011-10-16 NOTE — Patient Instructions (Addendum)
Will obtain labs today and call you with the results (lp/bmet/hfp/tsh/cbc)  Your physician recommends that you continue on your current medications as directed. Please refer to the Current Medication list given to you today.  Your physician wants you to follow-up in: 34month You will receive a reminder letter in the mail two months in advance. If you don't receive a letter, please call our office to schedule the follow-up appointment.  Work harder on weight loss and walking more

## 2011-10-16 NOTE — Progress Notes (Signed)
Mary Harrison Date of Birth:  [birth year] [facility] [address] [place], [place]  [address] [phone]         Fax   [phone]  History of Present Illness: This pleasant 78 year old woman is seen for a scheduled four-month followup office visit.  She has a past history of essential hypertension and history of hypercholesterolemia.  She's also had some problems with memory disorder and early dementia.  She does not have any history of ischemic heart disease.  Since last visit she has continued to have problems with her memory.  She could not tolerate Namenda because of headaches the cause.  She is on Inderal generic for prevention of headaches on a chronic basis.  The patient is taking Aricept 10 mg daily.  Current Outpatient Prescriptions  Medication Sig Dispense Refill  . amLODipine (NORVASC) 5 MG tablet Take 1 tablet (5 mg total) by mouth daily.  30 tablet  11  . aspirin 325 MG tablet Take 325 mg by mouth daily.       [name] atorvastatin (LIPITOR) 20 MG tablet TAKE 1/2 TABLET BY MOUTH EVERY DAY  15 tablet  PRN  . B Complex-C (B-COMPLEX WITH VITAMIN C) tablet Take 1 tablet by mouth daily.      [name] BENICAR HCT 40-12.5 MG per tablet TAKE 1 TABLET BY MOUTH EVERY DAY  30 tablet  PRN  . bumetanide (BUMEX) 1 MG tablet TAKE 1 TABLET BY MOUTH EVERY OTHER DAY AS NEEDED FOR FLUID  30 tablet  PRN  . Cholecalciferol (VITAMIN D) 2000 UNIT tablet Take 2,000 Units by mouth daily.        [name] co-enzyme Q-10 50 MG capsule Take 50 mg by mouth daily.      [name] donepezil (ARICEPT) 10 MG tablet Take 10 mg by mouth daily.        [name] gabapentin (NEURONTIN) 300 MG capsule TAKE 1 OR 2 CAPSULES BY MOUTH AT BEDTIME  60 capsule  3  . magnesium 30 MG tablet Take 30 mg by mouth daily.      [name] omeprazole (PRILOSEC) 40 MG capsule Take 40 mg by mouth daily.       . propranolol (INDERAL) 20 MG tablet Take 1 tablet (20 mg total) by mouth 2 (two) times daily.  60 tablet  11  . vitamin A 66440 UNIT capsule  Take 10,000 Units by mouth daily.      . vitamin C (ASCORBIC ACID) 500 MG tablet Take 500 mg by mouth daily.      . vitamin E 1000 UNIT capsule Take 1,000 Units by mouth daily.      [name] zolpidem (AMBIEN) 10 MG tablet TAKE ONE TABLET BY MOUTH AT BEDTIME AS NEEDED FOR SLEEP  100 tablet  3    Allergies  Allergen Reactions  . Namenda (Memantine Hcl) Anaphylaxis    headache  . Mevacor (Lovastatin)     Leg cramps  . Toprol Xl (Metoprolol Succinate) Palpitations    Patient Active Problem List  Diagnoses  . Hypertension  . Hypercholesteremia  . Headache  . Dyslipidemia  . Dementia  . Palpitation  . Weakness generalized    History  Smoking status  . Former Smoker  . Quit date: 08/06/1968  Smokeless tobacco  . Never Used    History  Alcohol Use  . 0.0 oz/week  . 1-3 Glasses of wine per week    Family History  Problem Relation Age of Onset  . Parkinsonism Mother   .  Heart disease Father     Review of Systems: Constitutional: no fever chills diaphoresis or fatigue or change in weight.  Head and neck: no hearing loss, no epistaxis, no photophobia or visual disturbance. Respiratory: No cough, shortness of breath or wheezing. Cardiovascular: No chest pain peripheral edema, palpitations. Gastrointestinal: No abdominal distention, no abdominal pain, no change in bowel habits hematochezia or melena. Genitourinary: No dysuria, no frequency, no urgency, no nocturia. Musculoskeletal:No arthralgias, no back pain, no gait disturbance or myalgias. Neurological: No dizziness, no headaches, no numbness, no seizures, no syncope, no weakness, no tremors. Hematologic: No lymphadenopathy, no easy bruising. Psychiatric: No confusion, no hallucinations, no sleep disturbance.    Physical Exam: Filed Vitals:   10/16/11 1025  BP: 140/80  Pulse: 61   the general appearance reveals a well-developed well-nourished woman in no distress.The head and neck exam reveals pupils equal and reactive.   Extraocular movements are full.  There is no scleral icterus.  The mouth and pharynx are normal.  The neck is supple.  The carotids reveal no bruits.  The jugular venous pressure is normal.  The  thyroid is not enlarged.  There is no lymphadenopathy.  The chest is clear to percussion and auscultation.  There are no rales or rhonchi.  Expansion of the chest is symmetrical.  The precordium is quiet.  The first heart sound is normal.  The second heart sound is physiologically split.  There is no murmur gallop rub or click.  There is no abnormal lift or heave.  The abdomen is soft and nontender.  The bowel sounds are normal.  The liver and spleen are not enlarged.  There are no abdominal masses.  There are no abdominal bruits.  Extremities reveal good pedal pulses.  There is no phlebitis or edema.  There is no cyanosis or clubbing.  Strength is normal and symmetrical in all extremities.  There is no lateralizing weakness.  There are no sensory deficits.  The skin is warm and dry.  There is no rash.  EKG today shows normal sinus rhythm and no ischemic changes.   Assessment / Plan: Continue same medication.  Work harder on regular exercise and weight loss.  Her weight today was the same as last time.  Blood work today pending.  Recheck in 4 months

## 2011-10-16 NOTE — Progress Notes (Signed)
Quick Note:  Please report to patient. The recent labs are stable. Continue same medication and careful diet. CBC and thyroid ok ______

## 2011-10-17 ENCOUNTER — Telehealth: Payer: Self-pay | Admitting: *Deleted

## 2011-10-17 NOTE — Telephone Encounter (Signed)
Advised of labs 

## 2011-10-17 NOTE — Telephone Encounter (Signed)
Message copied by Burnell Blanks on Wed Oct 17, 2011  9:06 AM ------      Message from: Cassell Clement      Created: Tue Oct 16, 2011  9:54 PM       Please report to patient.  The recent labs are stable. Continue same medication and careful diet. CBC and thyroid ok

## 2011-11-23 ENCOUNTER — Other Ambulatory Visit: Payer: Self-pay | Admitting: Cardiology

## 2011-11-23 DIAGNOSIS — G4700 Insomnia, unspecified: Secondary | ICD-10-CM

## 2012-01-07 ENCOUNTER — Encounter: Payer: Self-pay | Admitting: Cardiology

## 2012-01-07 ENCOUNTER — Ambulatory Visit (INDEPENDENT_AMBULATORY_CARE_PROVIDER_SITE_OTHER): Payer: Medicare Other | Admitting: Cardiology

## 2012-01-07 VITALS — BP 146/70 | HR 62 | Ht 64.0 in | Wt 165.0 lb

## 2012-01-07 DIAGNOSIS — I119 Hypertensive heart disease without heart failure: Secondary | ICD-10-CM

## 2012-01-07 DIAGNOSIS — G4700 Insomnia, unspecified: Secondary | ICD-10-CM

## 2012-01-07 DIAGNOSIS — E785 Hyperlipidemia, unspecified: Secondary | ICD-10-CM

## 2012-01-07 DIAGNOSIS — F0390 Unspecified dementia without behavioral disturbance: Secondary | ICD-10-CM

## 2012-01-07 DIAGNOSIS — I10 Essential (primary) hypertension: Secondary | ICD-10-CM

## 2012-01-07 DIAGNOSIS — E7800 Pure hypercholesterolemia, unspecified: Secondary | ICD-10-CM

## 2012-01-07 NOTE — Assessment & Plan Note (Signed)
The patient has a history of hypercholesterolemia and is on low-dose Lipitor 20 mg daily.  She's not having any myalgias from Lipitor 

## 2012-01-07 NOTE — Assessment & Plan Note (Signed)
Her dementia appears to be stable on current dose of Aricept 10 mg daily

## 2012-01-07 NOTE — Progress Notes (Signed)
Mary Harrison Date of Birth:  04-12-1931 [facility] [address] [place], [place]  [address] [phone]         Fax   [phone]  History of Present Illness: This pleasant 78 year old woman is seen for a scheduled followup office visit.  She has a history of atypical chest pain.  She does not have any history of ischemic heart disease.  She has had a previous normal nuclear stress test on 09/23/03 showing no ischemia and showing an ejection fraction of 50%.  He has a history of essential hypertension hypercholesterolemia and the problem with her memory and early dementia.  Her husband was recently diagnosed with lung cancer and she's been more anxious over his illness.  Current Outpatient Prescriptions  Medication Sig Dispense Refill  . amLODipine (NORVASC) 5 MG tablet Take 1 tablet (5 mg total) by mouth daily.  30 tablet  11  . aspirin 325 MG tablet Take 325 mg by mouth daily.       [name] atorvastatin (LIPITOR) 20 MG tablet TAKE 1/2 TABLET BY MOUTH EVERY DAY  15 tablet  PRN  . B Complex-C (B-COMPLEX WITH VITAMIN C) tablet Take 1 tablet by mouth daily.      [name] BENICAR HCT 40-12.5 MG per tablet TAKE 1 TABLET BY MOUTH EVERY DAY  30 tablet  PRN  . bumetanide (BUMEX) 1 MG tablet TAKE 1 TABLET BY MOUTH EVERY OTHER DAY AS NEEDED FOR FLUID  30 tablet  PRN  . Cholecalciferol (VITAMIN D) 2000 UNIT tablet Take 2,000 Units by mouth daily.        [name] co-enzyme Q-10 50 MG capsule Take 50 mg by mouth as needed.       . donepezil (ARICEPT) 10 MG tablet Take 10 mg by mouth daily.        [name] gabapentin (NEURONTIN) 300 MG capsule TAKE 1 OR 2 CAPSULES BY MOUTH AT BEDTIME  60 capsule  3  . ibuprofen (ADVIL,MOTRIN) 800 MG tablet as needed.      . magnesium 30 MG tablet Take 30 mg by mouth daily.      [name] omeprazole (PRILOSEC) 40 MG capsule Take 40 mg by mouth daily.       . SF 5000 PLUS 1.1 % CREA dental cream as directed.      . vitamin A 29528 UNIT capsule Take 10,000 Units by mouth as  needed.       . vitamin C (ASCORBIC ACID) 500 MG tablet Take 500 mg by mouth as needed.       . vitamin E 1000 UNIT capsule Take 1,000 Units by mouth daily.      [name] zolpidem (AMBIEN) 10 MG tablet TAKE ONE TABLET BY MOUTH AT BEDTIME AS NEEDED FOR SLEEP  100 tablet  1  . DISCONTD: memantine (NAMENDA TITRATION PAK) tablet pack 5 mg/day for =1 week; 5 mg twice daily for =1 week; 15 mg/day given in 5 mg and 10 mg separated doses for =1 week; then 10 mg twice daily  49 tablet  0    Allergies  Allergen Reactions  . Namenda (Memantine Hcl) Anaphylaxis    headache  . Mevacor (Lovastatin)     Leg cramps  . Toprol Xl (Metoprolol Succinate) Palpitations    Patient Active Problem List  Diagnoses  . Hypertension  . Hypercholesteremia  . Headache  . Dyslipidemia  . Dementia  . Palpitation  . Weakness generalized    History  Smoking status  . Former Smoker  .  Quit date: 08/06/1968  Smokeless tobacco  . Never Used    History  Alcohol Use  . 0.0 oz/week  . 1-3 Glasses of wine per week    Family History  Problem Relation Age of Onset  . Parkinsonism Mother   . Heart disease Father     Review of Systems: Constitutional: no fever chills diaphoresis or fatigue or change in weight.  Head and neck: no hearing loss, no epistaxis, no photophobia or visual disturbance. Respiratory: No cough, shortness of breath or wheezing. Cardiovascular: No chest pain peripheral edema, palpitations. Gastrointestinal: No abdominal distention, no abdominal pain, no change in bowel habits hematochezia or melena. Genitourinary: No dysuria, no frequency, no urgency, no nocturia. Musculoskeletal:No arthralgias, no back pain, no gait disturbance or myalgias. Neurological: No dizziness, no headaches, no numbness, no seizures, no syncope, no weakness, no tremors. Hematologic: No lymphadenopathy, no easy bruising. Psychiatric: No confusion, no hallucinations, no sleep disturbance.    Physical Exam: Filed  Vitals:   01/07/12 1132  BP: 146/70  Pulse: 62   the general appearance reveals a well-developed well-nourished woman in no distress.The head and neck exam reveals pupils equal and reactive.  Extraocular movements are full.  There is no scleral icterus.  The mouth and pharynx are normal.  The neck is supple.  The carotids reveal no bruits.  The jugular venous pressure is normal.  The  thyroid is not enlarged.  There is no lymphadenopathy.  The chest is clear to percussion and auscultation.  There are no rales or rhonchi.  Expansion of the chest is symmetrical.  The precordium is quiet.  The first heart sound is normal.  The second heart sound is physiologically split.  There is no murmur gallop rub or click.  There is no abnormal lift or heave.  The abdomen is soft and nontender.  The bowel sounds are normal.  The liver and spleen are not enlarged.  There are no abdominal masses.  There are no abdominal bruits.  Extremities reveal good pedal pulses.  There is no phlebitis or edema.  There is no cyanosis or clubbing.  Strength is normal and symmetrical in all extremities.  There is no lateralizing weakness.  There are no sensory deficits.  The skin is warm and dry.  There is no rash.     Assessment / Plan: Patient is to continue same medication.  Continue to avoid caffeine.  Continue on careful low-cholesterol weight-loss diet.  I tried to reassure reassure her that her husband's illness appears to be very stable at this point and that his postoperative recovery has been excellent.  She does tend to a lot.  She is to continue present medication and be rechecked in 4 months for followup office visit EKG lipid panel hepatic function panel and basal metabolic panel

## 2012-01-07 NOTE — Patient Instructions (Signed)
Your physician wants you to follow-up in: 4 MONTHS.  You will receive a reminder letter in the mail two months in advance. If you don't receive a letter, please call our office to schedule the follow-up appointment.  Your physician recommends that you continue on your current medications as directed. Please refer to the Current Medication list given to you today.  

## 2012-01-07 NOTE — Assessment & Plan Note (Signed)
The patient has a past history of essential hypertension.  She's not having a headaches or dizzy spells.  She's not having any symptoms of congestive heart failure.  He has been having problems with palpitations which she attributes to stress.  These can occur anytime day or night and has not required any specific treatment.  She is trying to avoid caffeine although they E. out frequently and she may be getting regular ice tea rather than decaf ice tea despite her request for decaf.

## 2012-01-16 ENCOUNTER — Other Ambulatory Visit: Payer: Self-pay | Admitting: Cardiology

## 2012-01-16 NOTE — Telephone Encounter (Signed)
Refilled aricept

## 2012-03-04 ENCOUNTER — Other Ambulatory Visit: Payer: Self-pay | Admitting: Cardiology

## 2012-03-04 NOTE — Telephone Encounter (Signed)
..   Requested Prescriptions   Pending Prescriptions Disp Refills  . amLODipine (NORVASC) 5 MG tablet [Pharmacy Med Name: AMLODIPINE BESYLATE 5MG  TABLETS] 30 tablet 6    Sig: TAKE 1 TABLET BY MOUTH DAILY  Please call the office to make a 4 month appointment

## 2012-03-25 ENCOUNTER — Other Ambulatory Visit: Payer: Self-pay | Admitting: Nurse Practitioner

## 2012-03-25 ENCOUNTER — Other Ambulatory Visit: Payer: Self-pay | Admitting: Cardiology

## 2012-03-27 NOTE — Telephone Encounter (Signed)
Rcvd refill request for patients propranolol 20 mg, taking 2 tablets daily.  No longer see this on patients medication list.  Called patient to verify she is still taking it, but the patient could give very little information about the medication she takes and admits that she has been skipping pills. The patient states she had it last filled in March and did not run out until a day ago.  I am unable to figure out if the patient is supposed to take these pills. Will forward to Regis Bill, RN for review.  Vista Mink, CMA

## 2012-04-01 ENCOUNTER — Telehealth: Payer: Self-pay | Admitting: Cardiology

## 2012-04-01 NOTE — Telephone Encounter (Signed)
Pt's husband calling re propanalol , was not refilled on 8-20 with the other two meds, needs refill, at [facility] [place], pls call [phone]

## 2012-04-02 ENCOUNTER — Other Ambulatory Visit: Payer: Self-pay

## 2012-04-03 MED ORDER — PROPRANOLOL HCL 20 MG PO TABS: 20.0000 mg | ORAL_TABLET | Freq: Two times a day (BID) | ORAL | Status: DC

## 2012-04-03 NOTE — Telephone Encounter (Signed)
Spoke to patients husband and the patient has been taking this pill for the last 2 years, twice daily.  Patient due for follow up with [name].  Sent in refill to pharmacy and transferred to scheduling for appointment.  Vista Mink, CMA

## 2012-05-08 ENCOUNTER — Encounter: Payer: Self-pay | Admitting: Cardiology

## 2012-05-08 ENCOUNTER — Other Ambulatory Visit (INDEPENDENT_AMBULATORY_CARE_PROVIDER_SITE_OTHER): Payer: Medicare Other

## 2012-05-08 ENCOUNTER — Ambulatory Visit (INDEPENDENT_AMBULATORY_CARE_PROVIDER_SITE_OTHER): Payer: Medicare Other | Admitting: Cardiology

## 2012-05-08 VITALS — BP 179/84 | HR 64 | Ht 64.0 in | Wt 168.0 lb

## 2012-05-08 DIAGNOSIS — I119 Hypertensive heart disease without heart failure: Secondary | ICD-10-CM

## 2012-05-08 DIAGNOSIS — F0390 Unspecified dementia without behavioral disturbance: Secondary | ICD-10-CM

## 2012-05-08 DIAGNOSIS — I10 Essential (primary) hypertension: Secondary | ICD-10-CM

## 2012-05-08 DIAGNOSIS — R5383 Other fatigue: Secondary | ICD-10-CM

## 2012-05-08 DIAGNOSIS — E785 Hyperlipidemia, unspecified: Secondary | ICD-10-CM

## 2012-05-08 DIAGNOSIS — E7800 Pure hypercholesterolemia, unspecified: Secondary | ICD-10-CM

## 2012-05-08 DIAGNOSIS — R002 Palpitations: Secondary | ICD-10-CM

## 2012-05-08 DIAGNOSIS — R531 Weakness: Secondary | ICD-10-CM

## 2012-05-08 DIAGNOSIS — R5381 Other malaise: Secondary | ICD-10-CM

## 2012-05-08 LAB — HEPATIC FUNCTION PANEL
Alkaline Phosphatase: 60 U/L (ref 39–117)
Bilirubin, Direct: 0.1 mg/dL (ref 0.0–0.3)
Total Bilirubin: 0.8 mg/dL (ref 0.3–1.2)
Total Protein: 6.6 g/dL (ref 6.0–8.3)

## 2012-05-08 LAB — LIPID PANEL
Cholesterol: 192 mg/dL (ref 0–200)
LDL Cholesterol: 105 mg/dL — ABNORMAL HIGH (ref 0–99)
VLDL: 19.8 mg/dL (ref 0.0–40.0)

## 2012-05-08 LAB — BASIC METABOLIC PANEL
BUN: 9 mg/dL (ref 6–23)
CO2: 28 mEq/L (ref 19–32)
Chloride: 104 mEq/L (ref 96–112)
Creatinine, Ser: 0.5 mg/dL (ref 0.4–1.2)
Glucose, Bld: 97 mg/dL (ref 70–99)
Potassium: 3.6 mEq/L (ref 3.5–5.1)

## 2012-05-08 NOTE — Assessment & Plan Note (Signed)
The patient is tolerating Lipitor 20 mg daily.  Blood work today is pending.  She is not having myalgias from Lipitor.

## 2012-05-08 NOTE — Assessment & Plan Note (Signed)
The patient has a history of dementia.  She has a decreased memory for recent events.  Her husband supplies a lot of the answers to my questions today.  In the past they have seen Dr. love but they would like to have another evaluation elsewhere of her memory disorder.  We will plan to refer her to neurology in [place] which is close to where they live in [place].

## 2012-05-08 NOTE — Assessment & Plan Note (Signed)
The patient is on low-dose propranolol 20 mg twice a day and she states that her palpitations have improved.

## 2012-05-08 NOTE — Patient Instructions (Addendum)
Lab work today   Your physician recommends that you schedule a follow-up appointment in: 4 months with fasting lab work     [employer] next week about appointment with Neurology in [facility]

## 2012-05-08 NOTE — Progress Notes (Signed)
Mary Harrison Date of Birth:  [birth year] [facility] [address] [place], [place]  [address] [phone]         Fax   [phone]  History of Present Illness: This pleasant 78 year old woman is seen for a scheduled followup office visit. She has a history of atypical chest pain. She does not have any history of ischemic heart disease. She has had a previous normal nuclear stress test on 09/23/03 showing no ischemia and showing an ejection fraction of 50%.  she has a history of essential hypertension, hypercholesterolemia and a problem with her memory and early dementia. Her husband was recently diagnosed with lung cancer and she's been more anxious over his illness. She is not known to be a diabetic.  She thinks that she may be experiencing hypoglycemic episodes.  We are checking a blood sugar today.  She and her husband usually eat a late breakfast and then in early supper and eat just 2 meals a day.   Current Outpatient Prescriptions  Medication Sig Dispense Refill  . amLODipine (NORVASC) 5 MG tablet TAKE 1 TABLET BY MOUTH DAILY  30 tablet  6  . aspirin 325 MG tablet Take 325 mg by mouth daily.       [name] atorvastatin (LIPITOR) 20 MG tablet TAKE 1/2 TABLET BY MOUTH EVERY DAY  15 tablet  PRN  . B Complex-C (B-COMPLEX WITH VITAMIN C) tablet Take 1 tablet by mouth daily.      [name] BENICAR HCT 40-12.5 MG per tablet TAKE 1 TABLET BY MOUTH EVERY DAY  30 tablet  PRN  . bumetanide (BUMEX) 1 MG tablet TAKE 1 TABLET BY MOUTH EVERY OTHER DAY AS NEEDED FOR FLUID  30 tablet  PRN  . Cholecalciferol (VITAMIN D) 2000 UNIT tablet Take 2,000 Units by mouth daily.        [name] co-enzyme Q-10 50 MG capsule Take 50 mg by mouth as needed.       . donepezil (ARICEPT) 10 MG tablet TAKE 1 TABLET BY MOUTH DAILY AFTER FINISHING 5 MG TABLETS  90 tablet  3  . gabapentin (NEURONTIN) 300 MG capsule TAKE 1 OR 2 CAPSULES BY MOUTH AT BEDTIME  60 capsule  3  . ibuprofen (ADVIL,MOTRIN) 800 MG tablet as  needed.      . magnesium 30 MG tablet Take 30 mg by mouth daily.      [name] omeprazole (PRILOSEC) 40 MG capsule Take 40 mg by mouth daily.       [name] omeprazole (PRILOSEC) 40 MG capsule TAKE ONE CAPSULE BY MOUTH DAILY  30 capsule  6  . propranolol (INDERAL) 20 MG tablet Take 1 tablet (20 mg total) by mouth 2 (two) times daily.  180 tablet  1  . SF 5000 PLUS 1.1 % CREA dental cream as directed.      . vitamin A 57846 UNIT capsule Take 10,000 Units by mouth as needed.       . vitamin C (ASCORBIC ACID) 500 MG tablet Take 500 mg by mouth as needed.       . vitamin E 1000 UNIT capsule Take 1,000 Units by mouth daily.      [name] zolpidem (AMBIEN) 10 MG tablet TAKE ONE TABLET BY MOUTH AT BEDTIME AS NEEDED FOR SLEEP  100 tablet  1  . DISCONTD: memantine (NAMENDA TITRATION PAK) tablet pack 5 mg/day for =1 week; 5 mg twice daily for =1 week; 15 mg/day given in 5 mg and 10 mg separated doses for =  1 week; then 10 mg twice daily  49 tablet  0    Allergies  Allergen Reactions  . Namenda (Memantine Hcl) Anaphylaxis    headache  . Mevacor (Lovastatin)     Leg cramps  . Toprol Xl (Metoprolol Succinate) Palpitations    Patient Active Problem List  Diagnosis  . Hypertension  . Hypercholesteremia  . Headache  . Dyslipidemia  . Dementia  . Palpitation  . Weakness generalized    History  Smoking status  . Former Smoker  . Quit date: 08/06/1968  Smokeless tobacco  . Never Used    History  Alcohol Use  . 0.0 oz/week  . 1-3 Glasses of wine per week    Family History  Problem Relation Age of Onset  . Parkinsonism Mother   . Heart disease Father     Review of Systems: Constitutional: no fever chills diaphoresis or fatigue or change in weight.  Head and neck: no hearing loss, no epistaxis, no photophobia or visual disturbance. Respiratory: No cough, shortness of breath or wheezing. Cardiovascular: No chest pain peripheral edema, palpitations. Gastrointestinal: No abdominal distention, no abdominal  pain, no change in bowel habits hematochezia or melena. Genitourinary: No dysuria, no frequency, no urgency, no nocturia. Musculoskeletal:No arthralgias, no back pain, no gait disturbance or myalgias. Neurological: No dizziness, no headaches, no numbness, no seizures, no syncope, no weakness, no tremors. Hematologic: No lymphadenopathy, no easy bruising. Psychiatric: No confusion, no hallucinations, no sleep disturbance.    Physical Exam: Filed Vitals:   05/08/12 1012  BP: 179/84  Pulse: 64   the general appearance reveals a well-developed well-nourished woman in no distress.The head and neck exam reveals pupils equal and reactive.  Extraocular movements are full.  There is no scleral icterus.  The mouth and pharynx are normal.  The neck is supple.  The carotids reveal no bruits.  The jugular venous pressure is normal.  The  thyroid is not enlarged.  There is no lymphadenopathy.  The chest is clear to percussion and auscultation.  There are no rales or rhonchi.  Expansion of the chest is symmetrical.  The precordium is quiet.  The first heart sound is normal.  The second heart sound is physiologically split.  There is no murmur gallop rub or click.  There is no abnormal lift or heave.  The abdomen is soft and nontender.  The bowel sounds are normal.  The liver and spleen are not enlarged.  There are no abdominal masses.  There are no abdominal bruits.  Extremities reveal good pedal pulses.  There is no phlebitis or edema.  There is no cyanosis or clubbing.  Strength is normal and symmetrical in all extremities.  There is no lateralizing weakness.  There are no sensory deficits.  The skin is warm and dry.  There is no rash.  EKG today shows normal sinus rhythm and minimal voltage criteria for LVH and nonspecific T-wave flattening   Assessment / Plan: Await results of today's blood work.  For her symptoms of possible hypoglycemia she will try to eat more frequent small feedings which are high in  protein. Regarding her dementia she did not continue with the Namenda because she did not think it was helping her.  She remains on Aricept.  We will refer her to neurology in [place].  Her husband will call us next week to request the referral. Recheck here in 4 months for followup office visit lipid panel basal metabolic panel and hepatic function panel.

## 2012-05-08 NOTE — Assessment & Plan Note (Signed)
The patient has not been experiencing any symptoms of congestive heart failure.  She has not been experiencing any severe headaches.  He has a mild headache today because she is fasting and did not get to eat breakfast

## 2012-05-08 NOTE — Progress Notes (Signed)
Quick Note:  Please report to patient. The recent labs are stable. Continue same medication and careful diet. ______ 

## 2012-05-14 ENCOUNTER — Telehealth: Payer: Self-pay | Admitting: *Deleted

## 2012-05-14 NOTE — Telephone Encounter (Signed)
Scheduled appointment with Dr Antonietta Barcelona neurologist next week. Husband stated another appointment scheduled for them on 05/22/12, he will call neurology office and reschedule

## 2012-06-02 ENCOUNTER — Other Ambulatory Visit: Payer: Self-pay | Admitting: Cardiology

## 2012-06-30 ENCOUNTER — Other Ambulatory Visit: Payer: Self-pay | Admitting: *Deleted

## 2012-06-30 DIAGNOSIS — G4700 Insomnia, unspecified: Secondary | ICD-10-CM

## 2012-06-30 MED ORDER — ZOLPIDEM TARTRATE 10 MG PO TABS: 10.0000 mg | ORAL_TABLET | Freq: Every evening | ORAL | Status: DC | PRN

## 2012-06-30 NOTE — Telephone Encounter (Signed)
Called to pharmacy 

## 2012-06-30 NOTE — Telephone Encounter (Signed)
Pt wanst zoldiem 10mg  sent to [facility] at [facility]. Will send to [facility] to verify.

## 2012-07-22 ENCOUNTER — Other Ambulatory Visit: Payer: Self-pay | Admitting: *Deleted

## 2012-07-22 MED ORDER — GABAPENTIN 300 MG PO CAPS: ORAL_CAPSULE | ORAL | Status: DC

## 2012-09-09 ENCOUNTER — Ambulatory Visit (INDEPENDENT_AMBULATORY_CARE_PROVIDER_SITE_OTHER): Payer: Medicare Other | Admitting: Cardiology

## 2012-09-09 ENCOUNTER — Encounter: Payer: Self-pay | Admitting: Cardiology

## 2012-09-09 VITALS — BP 121/70 | HR 67 | Resp 18 | Ht 65.0 in | Wt 171.0 lb

## 2012-09-09 DIAGNOSIS — E7800 Pure hypercholesterolemia, unspecified: Secondary | ICD-10-CM

## 2012-09-09 DIAGNOSIS — E785 Hyperlipidemia, unspecified: Secondary | ICD-10-CM

## 2012-09-09 DIAGNOSIS — F0390 Unspecified dementia without behavioral disturbance: Secondary | ICD-10-CM

## 2012-09-09 DIAGNOSIS — I10 Essential (primary) hypertension: Secondary | ICD-10-CM

## 2012-09-09 DIAGNOSIS — R5381 Other malaise: Secondary | ICD-10-CM

## 2012-09-09 DIAGNOSIS — I119 Hypertensive heart disease without heart failure: Secondary | ICD-10-CM

## 2012-09-09 LAB — LDL CHOLESTEROL, DIRECT: Direct LDL: 105.7 mg/dL

## 2012-09-09 LAB — LIPID PANEL
HDL: 63 mg/dL (ref >39.00)
Total CHOL/HDL Ratio: 3
Triglycerides: 175 mg/dL — ABNORMAL HIGH (ref 0.0–149.0)
VLDL: 35 mg/dL (ref 0.0–40.0)

## 2012-09-09 LAB — HEPATIC FUNCTION PANEL
Bilirubin, Direct: 0 mg/dL (ref 0.0–0.3)
Total Bilirubin: 0.7 mg/dL (ref 0.3–1.2)

## 2012-09-09 LAB — BASIC METABOLIC PANEL
CO2: 30 mEq/L (ref 19–32)
Chloride: 103 mEq/L (ref 96–112)
Glucose, Bld: 95 mg/dL (ref 70–99)
Sodium: 139 mEq/L (ref 135–145)

## 2012-09-09 NOTE — Progress Notes (Signed)
Mary Harrison Date of Birth:  Nov 08, 1930 [facility] [address] [place], [place]  [address] [phone]         Fax   [phone]  History of Present Illness: This pleasant 79 year old woman is seen for a scheduled followup office visit. She has a history of atypical chest pain. She does not have any history of ischemic heart disease. She has had a previous normal nuclear stress test on 09/23/03 showing no ischemia and showing an ejection fraction of 50%. she has a history of essential hypertension, hypercholesterolemia and a problem with her memory and early dementia. Her husband has been diagnosed with lung cancer and she's been more anxious over his illness.  She is not known to be a diabetic. She thinks that she may be experiencing hypoglycemic episodes.  She states that she is never hungry.  She feels that her taste buds have decreased in their effectiveness.   Current Outpatient Prescriptions  Medication Sig Dispense Refill  . amLODipine (NORVASC) 5 MG tablet TAKE 1 TABLET BY MOUTH DAILY  30 tablet  6  . aspirin 325 MG tablet Take 325 mg by mouth daily.       [name] atorvastatin (LIPITOR) 20 MG tablet TAKE 1/2 TABLET BY MOUTH EVERY DAY  15 tablet  PRN  . B Complex-C (B-COMPLEX WITH VITAMIN C) tablet Take 1 tablet by mouth daily.      [name] BENICAR HCT 40-12.5 MG per tablet TAKE 1 TABLET BY MOUTH EVERY DAY  30 tablet  PRN  . bumetanide (BUMEX) 1 MG tablet TAKE 1 TABLET BY MOUTH EVERY OTHER DAY AS NEEDED FOR FLUID  30 tablet  PRN  . Cholecalciferol (VITAMIN D) 2000 UNIT tablet Take 2,000 Units by mouth daily.        [name] co-enzyme Q-10 50 MG capsule Take 50 mg by mouth as needed.       . donepezil (ARICEPT) 10 MG tablet TAKE 1 TABLET BY MOUTH DAILY AFTER FINISHING 5 MG TABLETS  90 tablet  3  . gabapentin (NEURONTIN) 300 MG capsule 1 to 2 capsules by mouth at bedtime  60 capsule  5  . ibuprofen (ADVIL,MOTRIN) 800 MG tablet as needed.      . magnesium 30 MG tablet Take 30  mg by mouth daily.      [name] omeprazole (PRILOSEC) 40 MG capsule TAKE ONE CAPSULE BY MOUTH DAILY  30 capsule  6  . propranolol (INDERAL) 20 MG tablet TAKE 1 TABLET BY MOUTH TWICE DAILY  60 tablet  10  . SF 5000 PLUS 1.1 % CREA dental cream as directed.      . vitamin C (ASCORBIC ACID) 500 MG tablet Take 500 mg by mouth as needed.       . vitamin E 1000 UNIT capsule Take 1,000 Units by mouth daily.      [name] zolpidem (AMBIEN) 10 MG tablet Take 1 tablet (10 mg total) by mouth at bedtime as needed for sleep.  90 tablet  1  . [DISCONTINUED] memantine (NAMENDA TITRATION PAK) tablet pack 5 mg/day for =1 week; 5 mg twice daily for =1 week; 15 mg/day given in 5 mg and 10 mg separated doses for =1 week; then 10 mg twice daily  49 tablet  0    Allergies  Allergen Reactions  . Namenda (Memantine Hcl) Anaphylaxis    headache  . Mevacor (Lovastatin)     Leg cramps  . Toprol Xl (Metoprolol Succinate) Palpitations    Patient Active  Problem List  Diagnosis  . Hypertension  . Hypercholesteremia  . Headache  . Dyslipidemia  . Dementia  . Palpitation  . Weakness generalized    History  Smoking status  . Former Smoker  . Quit date: 08/06/1968  Smokeless tobacco  . Never Used    History  Alcohol Use  . 0.0 oz/week  . 1-3 Glasses of wine per week    Family History  Problem Relation Age of Onset  . Parkinsonism Mother   . Heart disease Father     Review of Systems: Constitutional: no fever chills diaphoresis or fatigue or change in weight.  Head and neck: no hearing loss, no epistaxis, no photophobia or visual disturbance. Respiratory: No cough, shortness of breath or wheezing. Cardiovascular: No chest pain peripheral edema, palpitations. Gastrointestinal: No abdominal distention, no abdominal pain, no change in bowel habits hematochezia or melena. Genitourinary: No dysuria, no frequency, no urgency, no nocturia. Musculoskeletal:No arthralgias, no back pain, no gait disturbance or  myalgias. Neurological: No dizziness, no headaches, no numbness, no seizures, no syncope, no weakness, no tremors. Hematologic: No lymphadenopathy, no easy bruising. Psychiatric: No confusion, no hallucinations, no sleep disturbance.    Physical Exam: Filed Vitals:   09/09/12 1045  BP: 121/70  Pulse: 67  Resp: 18   the general appearance reveals a well-developed well-nourished woman in no distress.  Despite her poor appetite her weight is up 3 pounds.The head and neck exam reveals pupils equal and reactive.  Extraocular movements are full.  There is no scleral icterus.  The mouth and pharynx are normal.  The neck is supple.  The carotids reveal no bruits.  The jugular venous pressure is normal.  The  thyroid is not enlarged.  There is no lymphadenopathy.  The chest is clear to percussion and auscultation.  There are no rales or rhonchi.  Expansion of the chest is symmetrical.  The precordium is quiet.  The first heart sound is normal.  The second heart sound is physiologically split.  There is no murmur gallop rub or click.  There is no abnormal lift or heave.  The abdomen is soft and nontender.  The bowel sounds are normal.  The liver and spleen are not enlarged.  There are no abdominal masses.  There are no abdominal bruits.  Extremities reveal good pedal pulses.  There is no phlebitis or edema.  There is no cyanosis or clubbing.  Strength is normal and symmetrical in all extremities.  There is no lateralizing weakness.  There are no sensory deficits.  The skin is warm and dry.  There is no rash.     Assessment / Plan: We will continue same medication.  Await results of today's lab work.  She will stop her vitamin A supplements and see if this will make an improvement in her sense of taste. Recheck in 4 months for followup office visit and fasting lab work.

## 2012-09-09 NOTE — Assessment & Plan Note (Signed)
Blood pressure was remaining stable on current therapy.  She is not having any symptoms of congestive heart failure.  No dizziness or syncope.  No tachycardia or palpitations.

## 2012-09-09 NOTE — Patient Instructions (Addendum)
STOP YOUR VITAMIN A  Will obtain labs today and call you with the results (lp/bmet/hfp)  Increase your walking and work harder on losing weight.  Your physician recommends that you schedule a follow-up appointment in: 4 months with fasting labs (lp/bmet/hfp)

## 2012-09-09 NOTE — Assessment & Plan Note (Signed)
The patient's dementia appears to be relatively stable although she does get more confused at night according to her husband.  She remains on generic Aricept daily.

## 2012-09-09 NOTE — Progress Notes (Signed)
Quick Note:  Please report to patient. The recent labs are stable. Continue same medication and careful diet. Cholesterol and TGs higher so careful diet is very important. The blood sugar was normal 95. ______

## 2012-09-09 NOTE — Assessment & Plan Note (Signed)
The patient has been on Lipitor 20 mg daily.  She is not having any myalgias.  We're checking lab work today.

## 2012-09-12 ENCOUNTER — Telehealth: Payer: Self-pay | Admitting: *Deleted

## 2012-09-12 NOTE — Telephone Encounter (Signed)
Advised husband of labs 

## 2012-09-12 NOTE — Telephone Encounter (Signed)
Message copied by Burnell Blanks on Fri Sep 12, 2012  9:53 AM ------      Message from: Cassell Clement      Created: Tue Sep 09, 2012  9:11 PM       Please report to patient.  The recent labs are stable. Continue same medication and careful diet. Cholesterol and TGs higher so careful diet is very important.  The blood sugar was normal 95.

## 2012-09-22 ENCOUNTER — Other Ambulatory Visit: Payer: Self-pay

## 2012-09-22 MED ORDER — BUMETANIDE 1 MG PO TABS: ORAL_TABLET | ORAL | Status: DC

## 2012-12-18 ENCOUNTER — Other Ambulatory Visit: Payer: Self-pay | Admitting: *Deleted

## 2012-12-18 DIAGNOSIS — G4700 Insomnia, unspecified: Secondary | ICD-10-CM

## 2012-12-18 MED ORDER — ZOLPIDEM TARTRATE 10 MG PO TABS: 10.0000 mg | ORAL_TABLET | Freq: Every evening | ORAL | Status: DC | PRN

## 2013-01-19 ENCOUNTER — Other Ambulatory Visit: Payer: Self-pay | Admitting: Cardiology

## 2013-01-20 ENCOUNTER — Ambulatory Visit (INDEPENDENT_AMBULATORY_CARE_PROVIDER_SITE_OTHER): Payer: Medicare Other | Admitting: Cardiology

## 2013-01-20 ENCOUNTER — Other Ambulatory Visit (INDEPENDENT_AMBULATORY_CARE_PROVIDER_SITE_OTHER): Payer: Medicare Other

## 2013-01-20 ENCOUNTER — Encounter: Payer: Self-pay | Admitting: Cardiology

## 2013-01-20 VITALS — BP 132/66 | HR 60 | Ht 64.5 in | Wt 164.1 lb

## 2013-01-20 DIAGNOSIS — E7800 Pure hypercholesterolemia, unspecified: Secondary | ICD-10-CM

## 2013-01-20 DIAGNOSIS — E785 Hyperlipidemia, unspecified: Secondary | ICD-10-CM

## 2013-01-20 DIAGNOSIS — I119 Hypertensive heart disease without heart failure: Secondary | ICD-10-CM

## 2013-01-20 DIAGNOSIS — I10 Essential (primary) hypertension: Secondary | ICD-10-CM

## 2013-01-20 MED ORDER — DONEPEZIL HCL 5 MG PO TABS: 5.0000 mg | ORAL_TABLET | Freq: Every day | ORAL | Status: DC

## 2013-01-20 NOTE — Assessment & Plan Note (Signed)
The patient has mild to moderate dementia.  She previously did not do well on Namenda.  She has done better on Aricept but thinks that the 10 mg may be too strong and may be causing her to have some headaches.  We will reduce the dose to just 5 mg daily and observe response

## 2013-01-20 NOTE — Progress Notes (Signed)
Mary Harrison Date of Birth:  10-18-1930 [facility] [address] [place], [place]  [address] [phone]         Fax   [phone]  History of Present Illness: This pleasant 79 year old woman is seen for a scheduled followup office visit. She has a history of atypical chest pain. She does not have any history of ischemic heart disease. She has had a previous normal nuclear stress test on 09/23/03 showing no ischemia and showing an ejection fraction of 50%. she has a history of essential hypertension, hypercholesterolemia and a problem with her memory and early dementia. Her husband has been diagnosed with lung cancer and she's been more anxious over his illness.  She is not known to be a diabetic. She thinks that she may be experiencing hypoglycemic episodes.  These episodes occur usually in the evening after she takes all of her medicines.  The episodes are not corrected by eating food.  Current Outpatient Prescriptions  Medication Sig Dispense Refill  . amLODipine (NORVASC) 5 MG tablet TAKE 1 TABLET BY MOUTH DAILY  30 tablet  6  . aspirin 325 MG tablet Take 325 mg by mouth daily.       [name] atorvastatin (LIPITOR) 40 MG tablet Take 20 mg by mouth daily.      . B Complex-C (B-COMPLEX WITH VITAMIN C) tablet Take 1 tablet by mouth daily.      [name] BENICAR HCT 40-12.5 MG per tablet TAKE 1 TABLET BY MOUTH EVERY DAY  30 tablet  PRN  . bumetanide (BUMEX) 1 MG tablet TAKE 1 TABLET BY MOUTH EVERY OTHER DAY AS NEEDED FOR FLUID  30 tablet  PRN  . Cholecalciferol (VITAMIN D) 2000 UNIT tablet Take 2,000 Units by mouth daily.        [name] co-enzyme Q-10 50 MG capsule Take 50 mg by mouth as needed.       . donepezil (ARICEPT) 5 MG tablet Take 1 tablet (5 mg total) by mouth at bedtime.  90 tablet  3  . gabapentin (NEURONTIN) 300 MG capsule 1 to 2 capsules by mouth at bedtime  60 capsule  5  . ibuprofen (ADVIL,MOTRIN) 800 MG tablet as needed.      . magnesium 30 MG tablet Take 30 mg by  mouth daily.      [name] omeprazole (PRILOSEC) 40 MG capsule TAKE ONE CAPSULE BY MOUTH DAILY  30 capsule  0  . propranolol (INDERAL) 20 MG tablet TAKE 1 TABLET BY MOUTH TWICE DAILY  60 tablet  10  . SF 5000 PLUS 1.1 % CREA dental cream as directed.      . vitamin C (ASCORBIC ACID) 500 MG tablet Take 500 mg by mouth as needed.       . vitamin E 1000 UNIT capsule Take 1,000 Units by mouth daily.      [name] zolpidem (AMBIEN) 10 MG tablet Take 5 mg by mouth at bedtime as needed for sleep.      . [DISCONTINUED] memantine (NAMENDA TITRATION PAK) tablet pack 5 mg/day for =1 week; 5 mg twice daily for =1 week; 15 mg/day given in 5 mg and 10 mg separated doses for =1 week; then 10 mg twice daily  49 tablet  0   No current facility-administered medications for this visit.    Allergies  Allergen Reactions  . Namenda (Memantine Hcl) Anaphylaxis    headache  . Mevacor (Lovastatin)     Leg cramps  . Toprol Xl (Metoprolol Succinate) Palpitations  Patient Active Problem List   Diagnosis Date Noted  . Dyslipidemia     Priority: High  . Hypertension     Priority: Medium  . Dementia     Priority: Medium  . Weakness generalized 05/04/2011  . Palpitation 12/08/2010  . Hypercholesteremia   . Headache     History  Smoking status  . Former Smoker  . Quit date: 08/06/1968  Smokeless tobacco  . Never Used    History  Alcohol Use  . 0.0 oz/week  . 1-3 Glasses of wine per week    Family History  Problem Relation Age of Onset  . Parkinsonism Mother   . Heart disease Father     Review of Systems: Constitutional: no fever chills diaphoresis or fatigue or change in weight.  Head and neck: no hearing loss, no epistaxis, no photophobia or visual disturbance. Respiratory: No cough, shortness of breath or wheezing. Cardiovascular: No chest pain peripheral edema, palpitations. Gastrointestinal: No abdominal distention, no abdominal pain, no change in bowel habits hematochezia or  melena. Genitourinary: No dysuria, no frequency, no urgency, no nocturia. Musculoskeletal:No arthralgias, no back pain, no gait disturbance or myalgias. Neurological: No dizziness, no headaches, no numbness, no seizures, no syncope, no weakness, no tremors. Hematologic: No lymphadenopathy, no easy bruising. Psychiatric: No confusion, no hallucinations, no sleep disturbance.    Physical Exam: Filed Vitals:   01/20/13 1054  BP: 132/66  Pulse: 60   the general appearance reveals a well-developed well-nourished pleasant woman in no distress.  She demonstrates mild memory deficit.  Her husband accompanies her to the visit today.The head and neck exam reveals pupils equal and reactive.  Extraocular movements are full.  There is no scleral icterus.  The mouth and pharynx are normal.  The neck is supple.  The carotids reveal no bruits.  The jugular venous pressure is normal.  The  thyroid is not enlarged.  There is no lymphadenopathy.  The chest is clear to percussion and auscultation.  There are no rales or rhonchi.  Expansion of the chest is symmetrical.  The precordium is quiet.  The first heart sound is normal.  The second heart sound is physiologically split.  There is no murmur gallop rub or click.  There is no abnormal lift or heave.  The abdomen is soft and nontender.  The bowel sounds are normal.  The liver and spleen are not enlarged.  There are no abdominal masses.  There are no abdominal bruits.  Extremities reveal good pedal pulses.  There is no phlebitis or edema.  There is no cyanosis or clubbing.  Strength is normal and symmetrical in all extremities.  There is no lateralizing weakness.  There are no sensory deficits.  The skin is warm and dry.  There is no rash.     Assessment / Plan: We're checking fasting lab work today.  Continue same medication except increase Aricept to just 5 mg daily Recheck in 4 months for followup office visit lipid panel hepatic function panel and basal  metabolic panel

## 2013-01-20 NOTE — Assessment & Plan Note (Signed)
Blood pressure was remaining stable on current therapy.  She is not having any symptoms to suggest congestive heart failure.

## 2013-01-20 NOTE — Patient Instructions (Addendum)
Your physician wants you to follow-up in: 4 MONTHS You will receive a reminder letter in the mail two months in advance. If you don't receive a letter, please call our office to schedule the follow-up appointment.  Your physician recommends that you return for a FASTING lipid profile: 4 MONTHS   Your physician has recommended you make the following change in your medication:   DECREASE ARICEPT TO 5 MG DAILY

## 2013-01-20 NOTE — Assessment & Plan Note (Signed)
Patient has a history of hypercholesterolemia and is on Lipitor 40 mg daily.  We're checking lab work today.  She is not having any myalgias.

## 2013-01-21 LAB — HEPATIC FUNCTION PANEL
ALT: 15 U/L (ref 0–35)
Alkaline Phosphatase: 57 U/L (ref 39–117)
Bilirubin, Direct: 0 mg/dL (ref 0.0–0.3)
Total Protein: 7 g/dL (ref 6.0–8.3)

## 2013-01-21 LAB — LIPID PANEL
Cholesterol: 191 mg/dL (ref 0–200)
Triglycerides: 117 mg/dL (ref 0.0–149.0)
VLDL: 23.4 mg/dL (ref 0.0–40.0)

## 2013-01-21 LAB — BASIC METABOLIC PANEL
BUN: 12 mg/dL (ref 6–23)
Creatinine, Ser: 0.6 mg/dL (ref 0.4–1.2)
GFR: 102.37 mL/min (ref >60.00)
Potassium: 4 mEq/L (ref 3.5–5.1)

## 2013-01-21 NOTE — Progress Notes (Signed)
Quick Note:  Please report to patient. The recent labs are stable. Continue same medication and careful diet. ______ 

## 2013-02-03 ENCOUNTER — Other Ambulatory Visit: Payer: Self-pay | Admitting: Cardiology

## 2013-02-12 ENCOUNTER — Telehealth: Payer: Self-pay | Admitting: Cardiology

## 2013-02-12 NOTE — Telephone Encounter (Signed)
Patient is having urinary problems. States she is having difficulty making it to the restroom before she has an accident. Denies any frequency or burning. Discussed with  [name] and patient needs to see GYN or urologist. Left message to call back

## 2013-02-12 NOTE — Telephone Encounter (Signed)
New problem   pts husband wants you to call him is all he would disclose

## 2013-02-12 NOTE — Telephone Encounter (Signed)
August 8 at 1:00 pm with Dr Sherron Monday. Advised husband   Spoke with husband, patient has no GYN will schedule ov with urologist.

## 2013-02-20 ENCOUNTER — Other Ambulatory Visit: Payer: Self-pay | Admitting: *Deleted

## 2013-02-20 MED ORDER — AMLODIPINE BESYLATE 5 MG PO TABS: ORAL_TABLET | ORAL | Status: DC

## 2013-02-26 ENCOUNTER — Other Ambulatory Visit: Payer: Self-pay | Admitting: Cardiology

## 2013-05-04 ENCOUNTER — Telehealth: Payer: Self-pay | Admitting: *Deleted

## 2013-05-04 ENCOUNTER — Other Ambulatory Visit: Payer: Self-pay | Admitting: Cardiology

## 2013-05-04 NOTE — Telephone Encounter (Signed)
Spoke to patient and husband to clarify how she is taking her Lipitor. Per patient and her husband she is taking 1/2 of a 20mg  tablet daily. O.k per Juliette Alcide, Dr. Yevonne Pax nurse to fill this way.

## 2013-05-21 ENCOUNTER — Encounter: Payer: Self-pay | Admitting: Cardiology

## 2013-05-21 ENCOUNTER — Ambulatory Visit (INDEPENDENT_AMBULATORY_CARE_PROVIDER_SITE_OTHER): Payer: Medicare Other | Admitting: Cardiology

## 2013-05-21 ENCOUNTER — Ambulatory Visit (HOSPITAL_COMMUNITY): Payer: Medicare Other | Attending: Cardiology

## 2013-05-21 VITALS — BP 164/70 | HR 70 | Ht 64.5 in | Wt 167.0 lb

## 2013-05-21 DIAGNOSIS — R42 Dizziness and giddiness: Secondary | ICD-10-CM

## 2013-05-21 DIAGNOSIS — I119 Hypertensive heart disease without heart failure: Secondary | ICD-10-CM

## 2013-05-21 DIAGNOSIS — Z87891 Personal history of nicotine dependence: Secondary | ICD-10-CM | POA: Insufficient documentation

## 2013-05-21 DIAGNOSIS — R51 Headache: Secondary | ICD-10-CM | POA: Insufficient documentation

## 2013-05-21 DIAGNOSIS — I10 Essential (primary) hypertension: Secondary | ICD-10-CM | POA: Insufficient documentation

## 2013-05-21 DIAGNOSIS — F0390 Unspecified dementia without behavioral disturbance: Secondary | ICD-10-CM

## 2013-05-21 DIAGNOSIS — I658 Occlusion and stenosis of other precerebral arteries: Secondary | ICD-10-CM | POA: Insufficient documentation

## 2013-05-21 DIAGNOSIS — R002 Palpitations: Secondary | ICD-10-CM

## 2013-05-21 DIAGNOSIS — I6529 Occlusion and stenosis of unspecified carotid artery: Secondary | ICD-10-CM

## 2013-05-21 DIAGNOSIS — E7800 Pure hypercholesterolemia, unspecified: Secondary | ICD-10-CM

## 2013-05-21 DIAGNOSIS — E785 Hyperlipidemia, unspecified: Secondary | ICD-10-CM | POA: Insufficient documentation

## 2013-05-21 MED ORDER — AMLODIPINE BESYLATE 10 MG PO TABS: ORAL_TABLET | ORAL | Status: DC

## 2013-05-21 NOTE — Assessment & Plan Note (Signed)
Blood pressure today here in the office his side.  Her husband states that her blood pressure often is high at home as well.  She presently is taking amlodipine 5 mg daily and we will increase that to 10 mg daily.  She's not having any peripheral edema at this point.

## 2013-05-21 NOTE — Assessment & Plan Note (Signed)
Her symptoms of dementia appear to be stable since last visit

## 2013-05-21 NOTE — Patient Instructions (Signed)
Your physician has requested that you have a carotid duplex. This test is an ultrasound of the carotid arteries in your neck. It looks at blood flow through these arteries that supply the brain with blood. Allow one hour for this exam. There are no restrictions or special instructions.  INCREASE YOUR AMLODIPINE TO 10 MG DAILY  Your physician wants you to follow-up in: 4 months with fasting labs (lp/bmet/hfp) and EKG  You will receive a reminder letter in the mail two months in advance. If you don't receive a letter, please call our office to schedule the follow-up appointment.

## 2013-05-21 NOTE — Assessment & Plan Note (Signed)
The patient has been experiencing feelings of lightheadedness and dizziness.  Her blood pressure has been running slightly high at home according to her husband.  Examination does not reveal any carotid bruits.  She is concerned.  We will get carotid Doppler.

## 2013-05-21 NOTE — Assessment & Plan Note (Signed)
The patient has not been aware of any recent racing of her heart or palpitations.  She does feel her heart beating forcefully at times and this may be related to her elevated systolic blood pressure.  Heart rate today is normal

## 2013-05-21 NOTE — Progress Notes (Signed)
Mary Harrison Date of Birth:  [birth year] [address] [place], [place]  [address] [phone]         Fax   [phone]  History of Present Illness: This pleasant 79 year old woman is seen for a scheduled followup office visit. She has a history of atypical chest pain. She does not have any history of ischemic heart disease. She has had a previous normal nuclear stress test on 09/23/03 showing no ischemia and showing an ejection fraction of 50%. she has a history of essential hypertension, hypercholesterolemia and a problem with her memory and early dementia. Her husband has been diagnosed with lung cancer and she's been more anxious over his illness.  She is not known to be a diabetic. She thinks that she may be experiencing hypoglycemic episodes.  These episodes occur usually in the evening after she takes all of her medicines.  The episodes are not corrected by eating food.  The patient has also been experiencing episodes of dizziness.  She is concerned as to whether she has poor circulation problems leading to the brain.  She has not had a previous carotid Doppler. Current Outpatient Prescriptions  Medication Sig Dispense Refill  . amLODipine (NORVASC) 10 MG tablet TAKE 1 TABLET BY MOUTH DAILY  30 tablet  5  . aspirin 325 MG tablet Take 325 mg by mouth daily.       [name] atorvastatin (LIPITOR) 40 MG tablet Take 20 mg by mouth daily.      . B Complex-C (B-COMPLEX WITH VITAMIN C) tablet Take 1 tablet by mouth daily.      [name] BENICAR HCT 40-12.5 MG per tablet TAKE 1 TABLET BY MOUTH EVERY DAY  30 tablet  PRN  . bumetanide (BUMEX) 1 MG tablet TAKE 1 TABLET BY MOUTH EVERY OTHER DAY AS NEEDED FOR FLUID  30 tablet  PRN  . Cholecalciferol (VITAMIN D) 2000 UNIT tablet Take 2,000 Units by mouth daily.        [name] co-enzyme Q-10 50 MG capsule Take 50 mg by mouth as needed.       . donepezil (ARICEPT) 5 MG tablet Take 1 tablet (5 mg total) by mouth at bedtime.  90 tablet  3  . gabapentin  (NEURONTIN) 300 MG capsule 1 to 2 capsules by mouth at bedtime  60 capsule  5  . ibuprofen (ADVIL,MOTRIN) 800 MG tablet as needed.      . magnesium 30 MG tablet Take 30 mg by mouth daily.      [name] omeprazole (PRILOSEC) 40 MG capsule TAKE 1 CAPSULE BY MOUTH EVERY DAY  30 capsule  6  . propranolol (INDERAL) 20 MG tablet TAKE 1 TABLET BY MOUTH TWICE DAILY  60 tablet  10  . SF 5000 PLUS 1.1 % CREA dental cream as directed.      . vitamin C (ASCORBIC ACID) 500 MG tablet Take 500 mg by mouth as needed.       . vitamin E 1000 UNIT capsule Take 1,000 Units by mouth daily.      [name] zolpidem (AMBIEN) 10 MG tablet Take 5 mg by mouth at bedtime as needed for sleep.      . [DISCONTINUED] memantine (NAMENDA TITRATION PAK) tablet pack 5 mg/day for =1 week; 5 mg twice daily for =1 week; 15 mg/day given in 5 mg and 10 mg separated doses for =1 week; then 10 mg twice daily  49 tablet  0   No current facility-administered medications for this visit.  Allergies  Allergen Reactions  . Namenda [Memantine Hcl] Anaphylaxis    headache  . Mevacor [Lovastatin]     Leg cramps  . Toprol Xl [Metoprolol Succinate] Palpitations    Patient Active Problem List   Diagnosis Date Noted  . Dyslipidemia     Priority: High  . Hypertension     Priority: Medium  . Dementia     Priority: Medium  . Weakness generalized 05/04/2011  . Palpitation 12/08/2010  . Hypercholesteremia   . Headache     History  Smoking status  . Former Smoker  . Quit date: 08/06/1968  Smokeless tobacco  . Never Used    History  Alcohol Use  . 0.0 oz/week  . 1-3 Glasses of wine per week    Family History  Problem Relation Age of Onset  . Parkinsonism Mother   . Heart disease Father     Review of Systems: Constitutional: no fever chills diaphoresis or fatigue or change in weight.  Head and neck: no hearing loss, no epistaxis, no photophobia or visual disturbance. Respiratory: No cough, shortness of breath or  wheezing. Cardiovascular: No chest pain peripheral edema, palpitations. Gastrointestinal: No abdominal distention, no abdominal pain, no change in bowel habits hematochezia or melena. Genitourinary: No dysuria, no frequency, no urgency, no nocturia. Musculoskeletal:No arthralgias, no back pain, no gait disturbance or myalgias. Neurological: No dizziness, no headaches, no numbness, no seizures, no syncope, no weakness, no tremors. Hematologic: No lymphadenopathy, no easy bruising. Psychiatric: No confusion, no hallucinations, no sleep disturbance.    Physical Exam: Filed Vitals:   05/21/13 1118  BP: 164/70  Pulse: 70   I rechecked her blood pressure myself and obtained essentially the same reading.  the general appearance reveals a well-developed well-nourished pleasant woman in no distress.  She demonstrates mild memory deficit.  Her husband accompanies her to the visit today.The head and neck exam reveals pupils equal and reactive.  Extraocular movements are full.  There is no scleral icterus.  The mouth and pharynx are normal.  The neck is supple.  The carotids reveal no bruits.  The jugular venous pressure is normal.  The  thyroid is not enlarged.  There is no lymphadenopathy.  The chest is clear to percussion and auscultation.  There are no rales or rhonchi.  Expansion of the chest is symmetrical.  The precordium is quiet.  The first heart sound is normal.  The second heart sound is physiologically split.  There is no murmur gallop rub or click.  There is no abnormal lift or heave.  The abdomen is soft and nontender.  The bowel sounds are normal.  The liver and spleen are not enlarged.  There are no abdominal masses.  There are no abdominal bruits.  Extremities reveal good pedal pulses.  There is no phlebitis or edema.  There is no cyanosis or clubbing.  Strength is normal and symmetrical in all extremities.  There is no lateralizing weakness.  There are no sensory deficits.  The skin is warm and  dry.  There is no rash.     Assessment / Plan: Obtain carotid duplex ultrasound. Increase amlodipine up to 10 mg daily. Recheck 4 months for office visit EKG and fasting lipid panel hepatic function panel and basal metabolic panel. Her weight is up 3 pounds.  I have encouraged her to try to lose weight

## 2013-05-25 ENCOUNTER — Telehealth: Payer: Self-pay | Admitting: *Deleted

## 2013-05-25 NOTE — Telephone Encounter (Signed)
Message copied by Burnell Blanks on Mon May 25, 2013 12:20 PM ------      Message from: Cassell Clement      Created: Fri May 22, 2013  6:45 PM       Carotids show only mild plaque, no significant stenosis. ------

## 2013-05-25 NOTE — Telephone Encounter (Signed)
Advised husband  

## 2013-06-11 ENCOUNTER — Other Ambulatory Visit: Payer: Self-pay | Admitting: Cardiology

## 2013-06-25 ENCOUNTER — Other Ambulatory Visit: Payer: Self-pay | Admitting: Cardiology

## 2013-06-26 ENCOUNTER — Other Ambulatory Visit: Payer: Self-pay | Admitting: Cardiology

## 2013-06-26 ENCOUNTER — Other Ambulatory Visit: Payer: Self-pay | Admitting: *Deleted

## 2013-06-26 DIAGNOSIS — F4321 Adjustment disorder with depressed mood: Secondary | ICD-10-CM

## 2013-06-26 MED ORDER — ZOLPIDEM TARTRATE 10 MG PO TABS: ORAL_TABLET | ORAL | Status: DC

## 2013-06-26 NOTE — Telephone Encounter (Addendum)
Dr Patty Sermons, is this ok to fill for Mary Harrison, and if so how many? The daughter called and stated that she is completely out and they have a funeral to go to today and she really needs it for the weekend. Thanks, MI  Yes. Her husband just died. Can give her # 30 with 2 refills.

## 2013-07-05 ENCOUNTER — Other Ambulatory Visit: Payer: Self-pay | Admitting: Cardiology

## 2013-07-06 ENCOUNTER — Telehealth: Payer: Self-pay | Admitting: Cardiology

## 2013-07-06 MED ORDER — PROPRANOLOL HCL 20 MG PO TABS: ORAL_TABLET | ORAL | Status: DC

## 2013-07-06 NOTE — Telephone Encounter (Signed)
Patient has not taken medications today. Will take medications now. Will discuss further with  [name] tomorrow

## 2013-07-06 NOTE — Telephone Encounter (Signed)
New problem:  Pt states she needs something for a terrible headache. Pt is requesting MeLinda give her a call back.

## 2013-07-07 NOTE — Telephone Encounter (Signed)
Unable to reach via phone today, will try again tomorrow. Discussed with  [name] and patient needs to follow up with neurologist for headaches.

## 2013-07-08 NOTE — Telephone Encounter (Signed)
Spoke with daughter and recommended neurology follow up

## 2013-07-08 NOTE — Telephone Encounter (Signed)
Spoke with patient and she would like for me to call her daughter Steward Drone. Left message to call back

## 2013-07-08 NOTE — Telephone Encounter (Signed)
Follow Up  Daughter-- Mary Harrison Returned the call//

## 2013-07-15 ENCOUNTER — Encounter: Payer: Self-pay | Admitting: Cardiology

## 2013-07-15 ENCOUNTER — Ambulatory Visit (INDEPENDENT_AMBULATORY_CARE_PROVIDER_SITE_OTHER): Payer: Medicare Other | Admitting: Cardiology

## 2013-07-15 VITALS — BP 120/70 | HR 68 | Ht 64.5 in | Wt 163.8 lb

## 2013-07-15 DIAGNOSIS — I10 Essential (primary) hypertension: Secondary | ICD-10-CM

## 2013-07-15 DIAGNOSIS — E7800 Pure hypercholesterolemia, unspecified: Secondary | ICD-10-CM

## 2013-07-15 DIAGNOSIS — G4700 Insomnia, unspecified: Secondary | ICD-10-CM

## 2013-07-15 DIAGNOSIS — F0390 Unspecified dementia without behavioral disturbance: Secondary | ICD-10-CM

## 2013-07-15 DIAGNOSIS — I119 Hypertensive heart disease without heart failure: Secondary | ICD-10-CM

## 2013-07-15 MED ORDER — TRAZODONE HCL 50 MG PO TABS: ORAL_TABLET | ORAL | Status: DC

## 2013-07-15 NOTE — Progress Notes (Signed)
Mary Harrison Date of Birth:  Sep 07, 1930 [address] [place], [place]  [address] [phone]         Fax   [phone]  History of Present Illness: This pleasant 79 year old woman is seen for a scheduled followup office visit.  She is a recent widow.  Her husband died of sudden cardiac death at home. She has a history of atypical chest pain. She does not have any history of ischemic heart disease. She has had a previous normal nuclear stress test on 09/23/03 showing no ischemia and showing an ejection fraction of 50%. she has a history of essential hypertension, hypercholesterolemia and a problem with her memory and early dementia.  Since her husband's death she has been having more problems with insomnia.  She has been on Ambien 10 mg at at bedtime.  She is running out early and maybe mistakenly taking double dose some evenings.  She mentioned that she never dreams and wondered if that could be a side effect of the Ambien Current Outpatient Prescriptions  Medication Sig Dispense Refill  . amLODipine (NORVASC) 10 MG tablet TAKE 1 TABLET BY MOUTH DAILY  30 tablet  5  . aspirin 325 MG tablet Take 325 mg by mouth daily.       [name] atorvastatin (LIPITOR) 40 MG tablet Take 20 mg by mouth daily.      [name] BENICAR HCT 40-12.5 MG per tablet TAKE 1 TABLET BY MOUTH DAILY  30 tablet  2  . bumetanide (BUMEX) 1 MG tablet TAKE 1 TABLET BY MOUTH EVERY OTHER DAY AS NEEDED FOR FLUID  30 tablet  PRN  . Cholecalciferol (VITAMIN D) 2000 UNIT tablet Take 2,000 Units by mouth daily.        [name] donepezil (ARICEPT) 5 MG tablet Take 1 tablet (5 mg total) by mouth at bedtime.  90 tablet  3  . gabapentin (NEURONTIN) 300 MG capsule 1 to 2 capsules by mouth at bedtime  60 capsule  5  . ibuprofen (ADVIL,MOTRIN) 800 MG tablet as needed.      . magnesium 30 MG tablet Take 250 mg by mouth daily.       . propranolol (INDERAL) 20 MG tablet TAKE 1 TABLET BY MOUTH TWICE DAILY  60 tablet  11  . SF 5000 PLUS 1.1 %  CREA dental cream as directed.      . vitamin C (ASCORBIC ACID) 500 MG tablet Take 500 mg by mouth as needed.       . vitamin E 1000 UNIT capsule Take 1,000 Units by mouth daily.      . traZODone (DESYREL) 50 MG tablet 1/2 tablet daily as needed for sleep      . [DISCONTINUED] memantine (NAMENDA TITRATION PAK) tablet pack 5 mg/day for =1 week; 5 mg twice daily for =1 week; 15 mg/day given in 5 mg and 10 mg separated doses for =1 week; then 10 mg twice daily  49 tablet  0   No current facility-administered medications for this visit.    Allergies  Allergen Reactions  . Namenda [Memantine Hcl] Anaphylaxis    headache  . Mevacor [Lovastatin]     Leg cramps  . Toprol Xl [Metoprolol Succinate] Palpitations    Patient Active Problem List   Diagnosis Date Noted  . Dyslipidemia     Priority: High  . Hypertension     Priority: Medium  . Dementia     Priority: Medium  . Dizziness 05/21/2013  . Weakness generalized  05/04/2011  . Palpitation 12/08/2010  . Hypercholesteremia   . Headache     History  Smoking status  . Former Smoker  . Quit date: 08/06/1968  Smokeless tobacco  . Never Used    History  Alcohol Use  . 0.0 oz/week  . 1-3 Glasses of wine per week    Family History  Problem Relation Age of Onset  . Parkinsonism Mother   . Heart disease Father     Review of Systems: Constitutional: no fever chills diaphoresis or fatigue or change in weight.  Head and neck: no hearing loss, no epistaxis, no photophobia or visual disturbance. Respiratory: No cough, shortness of breath or wheezing. Cardiovascular: No chest pain peripheral edema, palpitations. Gastrointestinal: No abdominal distention, no abdominal pain, no change in bowel habits hematochezia or melena. Genitourinary: No dysuria, no frequency, no urgency, no nocturia. Musculoskeletal:No arthralgias, no back pain, no gait disturbance or myalgias. Neurological: No dizziness, no headaches, no numbness, no seizures,  no syncope, no weakness, no tremors. Hematologic: No lymphadenopathy, no easy bruising. Psychiatric: No confusion, no hallucinations, no sleep disturbance.    Physical Exam: Filed Vitals:   07/15/13 1342  BP: 120/70  Pulse: 68   I rechecked her blood pressure myself and obtained essentially the same reading.  the general appearance reveals a well-developed well-nourished pleasant woman in no distress.  She demonstrates mild memory deficit.  Her husband accompanies her to the visit today.The head and neck exam reveals pupils equal and reactive.  Extraocular movements are full.  There is no scleral icterus.  The mouth and pharynx are normal.  The neck is supple.  The carotids reveal no bruits.  The jugular venous pressure is normal.  The  thyroid is not enlarged.  There is no lymphadenopathy.  The chest is clear to percussion and auscultation.  There are no rales or rhonchi.  Expansion of the chest is symmetrical.  The precordium is quiet.  The first heart sound is normal.  The second heart sound is physiologically split.  There is no murmur gallop rub or click.  There is no abnormal lift or heave.  The abdomen is soft and nontender.  The bowel sounds are normal.  The liver and spleen are not enlarged.  There are no abdominal masses.  There are no abdominal bruits.  Extremities reveal good pedal pulses.  There is no phlebitis or edema.  There is no cyanosis or clubbing.  Strength is normal and symmetrical in all extremities.  There is no lateralizing weakness.  There are no sensory deficits.  The skin is warm and dry.  There is no rash.     Assessment / Plan: The patient is to continue same medication except stop Ambien and start trazodone 25 mg at at bedtime.  I want her to get more regular exercise and this will also help her to sleep better at night. Recheck in late January for office visit EKG lipid panel hepatic function panel and basal metabolic panel

## 2013-07-15 NOTE — Assessment & Plan Note (Signed)
No chest pain or shortness of breath.  No palpitations.  No dizziness or syncope. 

## 2013-07-15 NOTE — Assessment & Plan Note (Signed)
Her family is concerned that the Ambien could be contributing to her dementia.  The patient remains on Aricept.  She is intolerant of Namenda

## 2013-07-15 NOTE — Assessment & Plan Note (Signed)
We're going to stop her Ambien.  In its place we will try trazodone 25 mg one at bedtime.  We may have to increase the dose to 50 mg at bedtime if necessary

## 2013-07-15 NOTE — Patient Instructions (Addendum)
STOP AMBIEN  START TRAZODONE 50 mg  1/2 tablet at BEDTIME AS NEEDED  INCREASE YOUR WALKING  Your physician recommends that you schedule a follow-up appointment in: end of January ov/ekg/lp/bmet/hfp

## 2013-07-17 ENCOUNTER — Ambulatory Visit: Payer: Medicare Other | Admitting: Cardiology

## 2013-08-10 ENCOUNTER — Other Ambulatory Visit: Payer: Self-pay | Admitting: Cardiology

## 2013-08-10 ENCOUNTER — Telehealth: Payer: Self-pay | Admitting: Cardiology

## 2013-08-10 DIAGNOSIS — G4700 Insomnia, unspecified: Secondary | ICD-10-CM

## 2013-08-10 NOTE — Telephone Encounter (Signed)
New message     Mother is taking more sleep medication than she should---she is now out of medication and pharmacy will not refill it.  She is taking 1 pill a day instead of 1/2 pill daily---daughter needs advice.

## 2013-08-11 MED ORDER — TRAZODONE HCL 50 MG PO TABS: 50.0000 mg | ORAL_TABLET | Freq: Every evening | ORAL | Status: DC | PRN

## 2013-08-11 NOTE — Telephone Encounter (Signed)
Discussed with  Dr. Patty SermonsBrackbill and ok for patient to increase Trazodone to a full tablet daily. Arliss JourneyAdvised Cindy and [facility]

## 2013-08-11 NOTE — Telephone Encounter (Signed)
Follow Up:  Pt's daughter, Arline AspCindy, is calling to speak with MeLinda in reference to the message that was sent yesterday evening.

## 2013-08-12 ENCOUNTER — Telehealth: Payer: Self-pay | Admitting: Cardiology

## 2013-08-12 NOTE — Telephone Encounter (Signed)
New Problem:  Pt states she recently lost her husband and she needs some help... (Pt is crying on the phone...)  Pt is requesting something for her nerves. She states she needs something to help her get over all of this.[name]..[name]

## 2013-08-12 NOTE — Telephone Encounter (Signed)
Spoke with patient and she was crying and very upset with the recent loss of her husband. Patient would like medication called in. Did talk with patient in great length and she stopped crying, encouraged she get involved in support groups locally. Explained that  Dr. Patty SermonsBrackbill out of the office this am.  Will forward to  Dr. Patty SermonsBrackbill for review

## 2013-08-12 NOTE — Telephone Encounter (Signed)
Increase trazodone to 50 mg twice a day to help with depression and sleep

## 2013-08-12 NOTE — Telephone Encounter (Signed)
Advised patient, verbalized understanding. Patient will call back next week with update on how she is feeling.

## 2013-09-03 ENCOUNTER — Ambulatory Visit (INDEPENDENT_AMBULATORY_CARE_PROVIDER_SITE_OTHER): Payer: Medicare Other | Admitting: Cardiology

## 2013-09-03 ENCOUNTER — Other Ambulatory Visit: Payer: Medicare Other

## 2013-09-03 ENCOUNTER — Encounter: Payer: Self-pay | Admitting: Cardiology

## 2013-09-03 VITALS — BP 156/64 | HR 60 | Ht 64.5 in | Wt 160.0 lb

## 2013-09-03 DIAGNOSIS — I10 Essential (primary) hypertension: Secondary | ICD-10-CM

## 2013-09-03 DIAGNOSIS — E7800 Pure hypercholesterolemia, unspecified: Secondary | ICD-10-CM

## 2013-09-03 DIAGNOSIS — I119 Hypertensive heart disease without heart failure: Secondary | ICD-10-CM

## 2013-09-03 DIAGNOSIS — G4700 Insomnia, unspecified: Secondary | ICD-10-CM

## 2013-09-03 DIAGNOSIS — R21 Rash and other nonspecific skin eruption: Secondary | ICD-10-CM

## 2013-09-03 DIAGNOSIS — E785 Hyperlipidemia, unspecified: Secondary | ICD-10-CM

## 2013-09-03 LAB — BASIC METABOLIC PANEL
BUN: 10 mg/dL (ref 6–23)
CALCIUM: 8.8 mg/dL (ref 8.4–10.5)
CO2: 27 meq/L (ref 19–32)
CREATININE: 0.6 mg/dL (ref 0.4–1.2)
Chloride: 103 mEq/L (ref 96–112)
GFR: 102.21 mL/min (ref >60.00)
GLUCOSE: 95 mg/dL (ref 70–99)
Potassium: 3.3 mEq/L — ABNORMAL LOW (ref 3.5–5.1)
Sodium: 139 mEq/L (ref 135–145)

## 2013-09-03 LAB — HEPATIC FUNCTION PANEL
ALT: 16 U/L (ref 0–35)
AST: 17 U/L (ref 0–37)
Albumin: 3.8 g/dL (ref 3.5–5.2)
Alkaline Phosphatase: 59 U/L (ref 39–117)
BILIRUBIN DIRECT: 0.1 mg/dL (ref 0.0–0.3)
Total Bilirubin: 1.1 mg/dL (ref 0.3–1.2)
Total Protein: 7 g/dL (ref 6.0–8.3)

## 2013-09-03 LAB — LIPID PANEL
Cholesterol: 218 mg/dL — ABNORMAL HIGH (ref 0–200)
HDL: 80 mg/dL (ref >39.00)
Total CHOL/HDL Ratio: 3
Triglycerides: 67 mg/dL (ref 0.0–149.0)
VLDL: 13.4 mg/dL (ref 0.0–40.0)

## 2013-09-03 LAB — LDL CHOLESTEROL, DIRECT: Direct LDL: 124.4 mg/dL

## 2013-09-03 MED ORDER — CLOBETASOL PROP EMOLLIENT BASE 0.05 % EX CREA: TOPICAL_CREAM | CUTANEOUS | Status: DC

## 2013-09-03 MED ORDER — TRAZODONE HCL 50 MG PO TABS: 50.0000 mg | ORAL_TABLET | Freq: Every evening | ORAL | Status: DC | PRN

## 2013-09-03 NOTE — Assessment & Plan Note (Signed)
The patient is still having some problems with insomnia.  She goes to bed around 10:00.  She wakes up around 3 and often takes the second half of her dose of trazodone.  We suggested that she take the full pill at bedtime but she does not want to do this.

## 2013-09-03 NOTE — Patient Instructions (Addendum)
Will obtain labs today and call you with the results (LP/BMET/HFP)  Your physician wants you to follow-up in: 4 months with fasting labs (lp/bmet/hfp)  You will receive a reminder letter in the mail two months in advance. If you don't receive a letter, please call our office to schedule the follow-up appointment.

## 2013-09-03 NOTE — Assessment & Plan Note (Signed)
Blood pressure is remaining stable on current therapy.  Patient is not having any headaches dizziness or syncope.

## 2013-09-03 NOTE — Progress Notes (Signed)
Mary Harrison Date of Birth:  1930/12/22 [address] [place], [place]  [address] [phone]         Fax   [phone]  History of Present Illness: This pleasant 80 year old woman is seen for a scheduled followup office visit.  She is a recent widow.  Her husband died of sudden cardiac death at home. She has a history of atypical chest pain. She does not have any history of ischemic heart disease. She has had a previous normal nuclear stress test on 09/23/03 showing no ischemia and showing an ejection fraction of 50%. she has a history of essential hypertension, hypercholesterolemia and a problem with her memory and early dementia.  Since her husband's death she has been having more problems with insomnia.  Since starting trazodone her sleep patterns and her depression has improved slightly. Current Outpatient Prescriptions  Medication Sig Dispense Refill  . amLODipine (NORVASC) 10 MG tablet TAKE 1 TABLET BY MOUTH DAILY  30 tablet  5  . aspirin 325 MG tablet Take 325 mg by mouth daily.       [name] atorvastatin (LIPITOR) 40 MG tablet Take 20 mg by mouth daily.      [name] BENICAR HCT 40-12.5 MG per tablet TAKE 1 TABLET BY MOUTH DAILY  30 tablet  2  . bumetanide (BUMEX) 1 MG tablet TAKE 1 TABLET BY MOUTH EVERY OTHER DAY AS NEEDED FOR FLUID  30 tablet  PRN  . Cholecalciferol (VITAMIN D) 2000 UNIT tablet Take 2,000 Units by mouth daily.        [name] donepezil (ARICEPT) 5 MG tablet Take 1 tablet (5 mg total) by mouth at bedtime.  90 tablet  3  . gabapentin (NEURONTIN) 300 MG capsule TAKE 1 TO 2 CAPSULES BY MOUTH EVERY NIGHT AT BEDTIME  60 capsule  6  . ibuprofen (ADVIL,MOTRIN) 800 MG tablet as needed.      . magnesium 30 MG tablet Take 250 mg by mouth daily.       . propranolol (INDERAL) 20 MG tablet TAKE 1 TABLET BY MOUTH TWICE DAILY  60 tablet  11  . SF 5000 PLUS 1.1 % CREA dental cream as directed.      . traZODone (DESYREL) 50 MG tablet Take 50 mg by mouth at bedtime as needed for  sleep.      . vitamin C (ASCORBIC ACID) 500 MG tablet Take 500 mg by mouth as needed.       . vitamin E 1000 UNIT capsule Take 1,000 Units by mouth daily.      . Clobetasol Prop Emollient Base (CLOBETASOL PROPIONATE E) 0.05 % emollient cream APPLY TO DAILY AS NEEDED  30 g  3  . [DISCONTINUED] memantine (NAMENDA TITRATION PAK) tablet pack 5 mg/day for =1 week; 5 mg twice daily for =1 week; 15 mg/day given in 5 mg and 10 mg separated doses for =1 week; then 10 mg twice daily  49 tablet  0   No current facility-administered medications for this visit.    Allergies  Allergen Reactions  . Namenda [Memantine Hcl] Anaphylaxis    headache  . Mevacor [Lovastatin]     Leg cramps  . Toprol Xl [Metoprolol Succinate] Palpitations    Patient Active Problem List   Diagnosis Date Noted  . Dyslipidemia     Priority: High  . Hypertension     Priority: Medium  . Dementia     Priority: Medium  . Insomnia 07/15/2013  . Dizziness 05/21/2013  .  Weakness generalized 05/04/2011  . Palpitation 12/08/2010  . Hypercholesteremia   . Headache     History  Smoking status  . Former Smoker  . Quit date: 08/06/1968  Smokeless tobacco  . Never Used    History  Alcohol Use  . 0.0 oz/week  . 1-3 Glasses of wine per week    Family History  Problem Relation Age of Onset  . Parkinsonism Mother   . Heart disease Father     Review of Systems: Constitutional: no fever chills diaphoresis or fatigue or change in weight.  Head and neck: no hearing loss, no epistaxis, no photophobia or visual disturbance. Respiratory: No cough, shortness of breath or wheezing. Cardiovascular: No chest pain peripheral edema, palpitations. Gastrointestinal: No abdominal distention, no abdominal pain, no change in bowel habits hematochezia or melena. Genitourinary: No dysuria, no frequency, no urgency, no nocturia. Musculoskeletal:No arthralgias, no back pain, no gait disturbance or myalgias. Neurological: No dizziness,  no headaches, no numbness, no seizures, no syncope, no weakness, no tremors. Hematologic: No lymphadenopathy, no easy bruising. Psychiatric: No confusion, no hallucinations, no sleep disturbance.    Physical Exam: Filed Vitals:   09/03/13 1000  BP: 156/64  Pulse: 60   I rechecked her blood pressure myself and obtained essentially the same reading.  the general appearance reveals a well-developed well-nourished pleasant woman in no distress.  She demonstrates mild memory deficit.  Her husband accompanies her to the visit today.The head and neck exam reveals pupils equal and reactive.  Extraocular movements are full.  There is no scleral icterus.  The mouth and pharynx are normal.  The neck is supple.  The carotids reveal no bruits.  The jugular venous pressure is normal.  The  thyroid is not enlarged.  There is no lymphadenopathy.  The chest is clear to percussion and auscultation.  There are no rales or rhonchi.  Expansion of the chest is symmetrical.  The precordium is quiet.  The first heart sound is normal.  The second heart sound is physiologically split.  There is no murmur gallop rub or click.  There is no abnormal lift or heave.  The abdomen is soft and nontender.  The bowel sounds are normal.  The liver and spleen are not enlarged.  There are no abdominal masses.  There are no abdominal bruits.  Extremities reveal good pedal pulses.  There is no phlebitis or edema.  There is no cyanosis or clubbing.  Strength is normal and symmetrical in all extremities.  There is no lateralizing weakness.  There are no sensory deficits.  The skin is warm and dry.  There is no rash.   EKG today shows normal sinus rhythm and is within normal limits   Assessment / Plan: The patient has had a good response to trazodone total dose 50 mg at bedtime. She is still in a reactive depression but she seems better.  She is getting out with friends more.  I have encouraged her to do some volunteer work at the hospital.   I would also like to see her joined a gym and start working out on a regular basis.  She has the potential membership in silver sneakers with her health insurance. Recheck in 4 months for office visit lipid panel hepatic function panel and basal metabolic panel.

## 2013-09-03 NOTE — Assessment & Plan Note (Signed)
Patient has a history of hypercholesterolemia and is on Lipitor generic 40 mg daily.  No myalgias.  We were checking blood work today results pending.

## 2013-09-04 ENCOUNTER — Other Ambulatory Visit: Payer: Self-pay | Admitting: *Deleted

## 2013-09-04 MED ORDER — POTASSIUM CHLORIDE CRYS ER 20 MEQ PO TBCR: 20.0000 meq | EXTENDED_RELEASE_TABLET | Freq: Every day | ORAL | Status: DC

## 2013-09-10 ENCOUNTER — Other Ambulatory Visit: Payer: Self-pay | Admitting: Cardiology

## 2013-09-10 MED ORDER — DONEPEZIL HCL 5 MG PO TABS: 5.0000 mg | ORAL_TABLET | Freq: Every day | ORAL | Status: DC

## 2013-09-27 ENCOUNTER — Other Ambulatory Visit: Payer: Self-pay | Admitting: Cardiology

## 2013-10-06 ENCOUNTER — Telehealth: Payer: Self-pay | Admitting: Cardiology

## 2013-10-06 NOTE — Telephone Encounter (Signed)
New message     Mom needs something for her nerves.  Husband passed away recently (nov 15th).  [facility] is her pharmacy.

## 2013-10-06 NOTE — Telephone Encounter (Signed)
Mothers emotions are all over the place, angry one minute and crying the next. She knows through her own experience that there are different stages of grieving. Daughter requesting that something be given to her mom to help her cope with this. Patient has been taking Trazodone 50 mg 1 to 2 at bedtime. Daughter does not really want for her to have a lot more pills at home.  Daughter aware that Trazodone can be given for depression. Daughter asked if ok to have her take 1/2 in the morning and 1 tomorrow night, advised Dr. Patty SermonsBrackbill out of the office until Friday. Will forward to him for review.

## 2013-10-07 NOTE — Telephone Encounter (Signed)
Left message to call back  

## 2013-10-07 NOTE — Telephone Encounter (Signed)
Yes okay [facility] 1[facility]2 tab [facility]

## 2013-10-09 NOTE — Telephone Encounter (Signed)
Follow up     Trazodone makes pt sweat a lot.  She has to change her pajamas and sheets.  Please call late this afternoon.  Is this a side effect?

## 2013-10-09 NOTE — Telephone Encounter (Signed)
Patient states the night sweats started after she started taking the Trazodone, will forward to  Dr. Patty SermonsBrackbill for review

## 2013-10-09 NOTE — Telephone Encounter (Signed)
Patient will call back if anymore problems

## 2013-10-09 NOTE — Telephone Encounter (Signed)
Discussed with  Dr. Patty SermonsBrackbill and will have patient try to just decrease to 1/2 tablet twice day. No night sweats listed as side effects. Advised patient

## 2013-10-14 ENCOUNTER — Other Ambulatory Visit: Payer: Self-pay | Admitting: Cardiology

## 2013-10-14 DIAGNOSIS — G4700 Insomnia, unspecified: Secondary | ICD-10-CM

## 2013-10-14 DIAGNOSIS — E7800 Pure hypercholesterolemia, unspecified: Secondary | ICD-10-CM

## 2013-10-21 ENCOUNTER — Telehealth: Payer: Self-pay | Admitting: Cardiology

## 2013-10-21 NOTE — Telephone Encounter (Signed)
Try cutting down on the trazodone to just half tablet in the morning and none at night and see if the night sweats resolve.  If they persist we will need to stop the trazodone altogether and see if the night sweats resolve.  If they do not resolve then it is not from the trazodone.

## 2013-10-21 NOTE — Telephone Encounter (Signed)
New message    Patient calling regarding night sweat she having. Having to changed pajama.

## 2013-10-21 NOTE — Telephone Encounter (Signed)
Left message to call back  

## 2013-10-21 NOTE — Telephone Encounter (Signed)
Having night sweats to the point she gets up to change 2-3 times a night. Unsure if this started before starting the Trazodone. Will forward to  Dr. Patty SermonsBrackbill for review.

## 2013-10-23 NOTE — Telephone Encounter (Signed)
Agree with advice given

## 2013-10-23 NOTE — Telephone Encounter (Signed)
Patient never started the Trazodone 1/2 twice a day, taking a full tablet at bedtime. Advised to try cutting the Trazodone to 1/2 tablet at bedtime to see if this would help, verbalized understanding.

## 2013-10-30 ENCOUNTER — Other Ambulatory Visit: Payer: Self-pay | Admitting: Cardiology

## 2013-10-30 DIAGNOSIS — G4700 Insomnia, unspecified: Secondary | ICD-10-CM

## 2013-10-30 MED ORDER — ZOLPIDEM TARTRATE 10 MG PO TABS: 10.0000 mg | ORAL_TABLET | Freq: Every evening | ORAL | Status: DC | PRN

## 2013-10-30 NOTE — Telephone Encounter (Signed)
Stop trazodone altogether to settle the issue about the night sweats

## 2013-10-30 NOTE — Telephone Encounter (Signed)
Discussed with  Dr. Patty SermonsBrackbill and will have patient stop Trazodone and restart Ambien 5 mg at bedtime as needed. Advised patient

## 2013-10-30 NOTE — Telephone Encounter (Signed)
Unable to take just 1/2 Trazodone secondary to insomnia but thinks it is causing the night sweats. Will forward to  Dr. Patty SermonsBrackbill for review

## 2013-10-30 NOTE — Telephone Encounter (Signed)
New message    Pt want something for night sweats.  She says she changes pajamas three times a night.  OK to leave message on vm.  ([facility] is her pharmacy)

## 2013-10-31 ENCOUNTER — Other Ambulatory Visit: Payer: Self-pay | Admitting: Cardiology

## 2013-11-02 MED ORDER — ZOLPIDEM TARTRATE 5 MG PO TABS: 5.0000 mg | ORAL_TABLET | Freq: Every evening | ORAL | Status: DC | PRN

## 2013-11-02 NOTE — Telephone Encounter (Signed)
Ambien 5 mg at bedtime as needed called to pharmacy not 10 mg.

## 2013-11-11 ENCOUNTER — Telehealth: Payer: Self-pay | Admitting: Cardiology

## 2013-11-11 MED ORDER — PAROXETINE HCL 20 MG PO TABS: 20.0000 mg | ORAL_TABLET | Freq: Every day | ORAL | Status: DC

## 2013-11-11 NOTE — Telephone Encounter (Signed)
Pt was called and verbalized understanding of dc of trazodone and to start paxil.

## 2013-11-11 NOTE — Telephone Encounter (Signed)
Spoke with pt who reports she is having a bad day. Upset about loss of husband last fall. Tearful on phone. She reports husband's friend passed away recently and she thinks this is contributing to her sadness.  She is taking trazodone but is asking if Dr. Patty SermonsBrackbill would prescribe something else for her. She does not want something that will make her sleepy but will help with her nerves. I told her Dr. Patty SermonsBrackbill is at the hospital today and I would forward the request to him.

## 2013-11-11 NOTE — Telephone Encounter (Signed)
New message          Pt just lost her husband and she said she needs something to be prescribed for her nerves. Please give pt a call.

## 2013-11-11 NOTE — Telephone Encounter (Signed)
Stop the trazodone and try Paxil generic 20 mg each morning for depression

## 2013-11-13 ENCOUNTER — Telehealth: Payer: Self-pay | Admitting: Cardiology

## 2013-11-13 NOTE — Telephone Encounter (Signed)
New message     Need referral to a neurologist because of memory loss.  They want to go to Dr Stacy GardnerLeanne Willis [phone] at [facility].  She said Dr Patty SermonsBrackbill is her mother's PCP.

## 2013-11-13 NOTE — Telephone Encounter (Signed)
Left message to call back for Steward DroneBrenda to call

## 2013-11-16 NOTE — Telephone Encounter (Signed)
Left message for doctors office to call back

## 2013-11-18 ENCOUNTER — Telehealth: Payer: Self-pay | Admitting: Cardiology

## 2013-11-18 NOTE — Telephone Encounter (Signed)
New message          Pt daughter is checking on referral [facility] [facility]. Please call pt daughter back.

## 2013-11-19 ENCOUNTER — Telehealth: Payer: Self-pay | Admitting: Cardiology

## 2013-11-19 NOTE — Telephone Encounter (Signed)
Burnell BlanksMelinda B Renatha Rosen at 11/19/2013 5:27 PM    Status: Signed       Will look for fax        Amanda CockayneShanterra M Robinson at 11/19/2013 4:41 PM     Status: Signed        New Message  Pt daughter states that she just spoke with someone at [facility], who states that they have faxed the information over 3 times. Daughter is asking that you continue to follow up.         Shanice L Simms at 11/19/2013 11:44 AM     Status: Signed        New message  Pt daughter is calling about setting up an appt in [place] for a neurologist. Pt daughter would like to know if you can give her a call back with an update.

## 2013-11-19 NOTE — Telephone Encounter (Signed)
Will look for fax

## 2013-11-19 NOTE — Telephone Encounter (Signed)
New message          Pt daughter is calling about setting up an appt in [place] for a neurologist. Pt daughter would like to know if you can give her a call back with an update.

## 2013-11-19 NOTE — Telephone Encounter (Signed)
New Message  Pt daughter states that she just spoke with someone at [facility], who states that they have faxed the information over 3 times. Daughter is asking that you continue to follow up.

## 2013-11-19 NOTE — Telephone Encounter (Signed)
Dafne Nield B Diamon Reddinger at 11/19/2013 5:27 PM    Status: Signed       Will look for fax        Shanterra M Robinson at 11/19/2013 4:41 PM     Status: Signed        New Message  Pt daughter states that she just spoke with someone at [facility], who states that they have faxed the information over 3 times. Daughter is asking that you continue to follow up.         Shanice L Simms at 11/19/2013 11:44 AM     Status: Signed        New message  Pt daughter is calling about setting up an appt in [place] for a neurologist. Pt daughter would like to know if you can give her a call back with an update.     

## 2013-11-20 NOTE — Telephone Encounter (Signed)
Unable to locate fax. Called and ask the office to refax

## 2013-11-23 ENCOUNTER — Telehealth: Payer: Self-pay | Admitting: Cardiology

## 2013-11-23 NOTE — Telephone Encounter (Signed)
New message     Pt is having "trembling spells."  She said Dr Patty SermonsBrackbill said before it is from a drop in her blood sugar.  She is eating and this is still happening. Please advise.

## 2013-11-23 NOTE — Telephone Encounter (Signed)
Called stating she is having "attacks" periodically.  Having night sweats, nausea, headaches, and "weak trembles" daily. She feels like she is having hypoglycemia. States she does eat during day. States her BP today was 177/102 HR 61.  Spent 45 min on the phone with her reviewing all of her medications.  She is still taking Trazodone and Paxil. Told her to mark an "X" on the Trazodone and not to take that any more.  Also she did not give me Benicar HCT as one of her medications  Did speak with pharmacist and was told the medication was picked up and should be taking. She is also taking Zolpidem 5 mg ( 1/2 tab) if can't go to sleep about 11-12 pm. Advised would send message to Dr. Patty SermonsBrackbill to address c/o above. Advised her to make sure she is taking her BP medications.  Seems that some of her c/o are due to taking the trazodone and Paxil. Spoke w/Brenda her daughter [phone]( [phone])  and reviewed medications with her. She will go by her home tonight and make sure she is taking the Benicar. Steward DroneBrenda was wondering if a referral to an endocrinologist would be appropriate for her to see if the nausea and "trembles" are related to hypoglycemia.

## 2013-11-23 NOTE — Telephone Encounter (Signed)
Difficult situation.  She is having a difficult time since the death of her husband.  [facility]

## 2013-11-24 ENCOUNTER — Ambulatory Visit (INDEPENDENT_AMBULATORY_CARE_PROVIDER_SITE_OTHER): Payer: Medicare Other | Admitting: Cardiology

## 2013-11-24 ENCOUNTER — Encounter: Payer: Self-pay | Admitting: Cardiology

## 2013-11-24 VITALS — BP 123/61 | HR 70 | Ht 64.0 in | Wt 155.0 lb

## 2013-11-24 DIAGNOSIS — F0390 Unspecified dementia without behavioral disturbance: Secondary | ICD-10-CM

## 2013-11-24 DIAGNOSIS — I119 Hypertensive heart disease without heart failure: Secondary | ICD-10-CM

## 2013-11-24 DIAGNOSIS — I10 Essential (primary) hypertension: Secondary | ICD-10-CM

## 2013-11-24 DIAGNOSIS — R002 Palpitations: Secondary | ICD-10-CM

## 2013-11-24 DIAGNOSIS — G4700 Insomnia, unspecified: Secondary | ICD-10-CM

## 2013-11-24 DIAGNOSIS — R42 Dizziness and giddiness: Secondary | ICD-10-CM

## 2013-11-24 NOTE — Assessment & Plan Note (Signed)
Her blood pressure was remaining reasonably stable on current medication.  She is not having any chest pain or angina.

## 2013-11-24 NOTE — Assessment & Plan Note (Signed)
She states that her insomnia appears to be improved on Ambien 5 mg daily

## 2013-11-24 NOTE — Assessment & Plan Note (Signed)
It is difficult to know how much of her current problem is her dementia and how much is related to situational depression.  She has been referred to [facility] to a geriatric neurology specialist Dr. Anne HahnWillis.

## 2013-11-24 NOTE — Progress Notes (Signed)
Mary Harrison Date of Birth:  1930/12/06 [address] [place], [place]  [id number] [phone]         Fax   [phone]  History of Present Illness: This pleasant 80 year old woman is seen for a scheduled followup office visit.  She is a recent widow.  Her husband died of sudden cardiac death at home. She has a history of atypical chest pain. She does not have any history of ischemic heart disease. She has had a previous normal nuclear stress test on 09/23/03 showing no ischemia and showing an ejection fraction of 50%. she has a history of essential hypertension, hypercholesterolemia and a problem with her memory and early dementia.  She has had problems with what she describes as a "weak trembles".  The family wonders if this could be related to a sudden drop in blood sugar.  She is not known to be diabetic.  We have advised the patient and family to purchase a glucometer so they can check her blood sugar at home.  The patient is having a lot of problems with adjustment reaction to her husband's death she is having more problems with her memory.  A referral to [facility] Dr. Anne HahnWillis is pending. Current Outpatient Prescriptions  Medication Sig Dispense Refill  . amLODipine (NORVASC) 10 MG tablet TAKE 1 TABLET BY MOUTH DAILY  30 tablet  5  . aspirin 325 MG tablet Take 325 mg by mouth daily.       [name]. atorvastatin (LIPITOR) 20 MG tablet TAKE 1/2 TABLET BY MOUTH DAILY  15 tablet  5  . B Complex Vitamins (VITAMIN B COMPLEX PO) Take by mouth. daily      . BENICAR HCT 40-12.5 MG per tablet TAKE 1 TABLET BY MOUTH DAILY  30 tablet  2  . bumetanide (BUMEX) 1 MG tablet TAKE 1 TABLET BY MOUTH EVERY OTHER DAY AS NEEDED FOR FLUID  30 tablet  3  . Cholecalciferol (VITAMIN D) 2000 UNIT tablet Take 2,000 Units by mouth daily.        . Clobetasol Prop Emollient Base (CLOBETASOL PROPIONATE E) 0.05 % emollient cream APPLY TO DAILY AS NEEDED  30 g  3  . donepezil (ARICEPT) 5 MG tablet  Take 1 tablet (5 mg total) by mouth at bedtime.  90 tablet  1  . gabapentin (NEURONTIN) 300 MG capsule TAKE 1 TO 2 CAPSULES BY MOUTH EVERY NIGHT AT BEDTIME  60 capsule  6  . ibuprofen (ADVIL,MOTRIN) 800 MG tablet as needed.      . magnesium 30 MG tablet Take 250 mg by mouth daily.       [name]. PARoxetine (PAXIL) 20 MG tablet Take 1 tablet (20 mg total) by mouth daily.  30 tablet  6  . potassium chloride SA (K-DUR,KLOR-CON) 20 MEQ tablet Take 1 tablet (20 mEq total) by mouth daily.  30 tablet  10  . propranolol (INDERAL) 20 MG tablet TAKE 1 TABLET BY MOUTH TWICE DAILY  60 tablet  11  . vitamin A 1308610000 UNIT capsule Take 10,000 Units by mouth daily.      . vitamin C (ASCORBIC ACID) 500 MG tablet Take 500 mg by mouth as needed.       . vitamin E 1000 UNIT capsule Take 1,000 Units by mouth daily.      [name]. zolpidem (AMBIEN) 5 MG tablet Take 1 tablet (5 mg total) by mouth at bedtime as needed for sleep.  30 tablet  3  . [  DISCONTINUED] memantine (NAMENDA TITRATION PAK) tablet pack 5 mg/day for =1 week; 5 mg twice daily for =1 week; 15 mg/day given in 5 mg and 10 mg separated doses for =1 week; then 10 mg twice daily  49 tablet  0   No current facility-administered medications for this visit.    Allergies  Allergen Reactions  . Namenda [Memantine Hcl] Anaphylaxis    headache  . Mevacor [Lovastatin]     Leg cramps  . Toprol Xl [Metoprolol Succinate] Palpitations    Patient Active Problem List   Diagnosis Date Noted  . Dyslipidemia     Priority: High  . Hypertension     Priority: Medium  . Dementia     Priority: Medium  . Insomnia 07/15/2013  . Dizziness 05/21/2013  . Weakness generalized 05/04/2011  . Palpitation 12/08/2010  . Hypercholesteremia   . Headache     History  Smoking status  . Former Smoker  . Quit date: 08/06/1968  Smokeless tobacco  . Never Used    History  Alcohol Use  . 0.0 oz/week  . 1-3 Glasses of wine per week    Family History  Problem Relation Age of Onset    . Parkinsonism Mother   . Heart disease Father     Review of Systems: Constitutional: no fever chills diaphoresis or fatigue or change in weight.  Head and neck: no hearing loss, no epistaxis, no photophobia or visual disturbance. Respiratory: No cough, shortness of breath or wheezing. Cardiovascular: No chest pain peripheral edema, palpitations. Gastrointestinal: No abdominal distention, no abdominal pain, no change in bowel habits hematochezia or melena. Genitourinary: No dysuria, no frequency, no urgency, no nocturia. Musculoskeletal:No arthralgias, no back pain, no gait disturbance or myalgias. Neurological: No dizziness, no headaches, no numbness, no seizures, no syncope, no weakness, no tremors. Hematologic: No lymphadenopathy, no easy bruising. Psychiatric: No confusion, no hallucinations, no sleep disturbance.    Physical Exam: Filed Vitals:   11/24/13 1524  BP: 123/61  Pulse: 70   I rechecked her blood pressure myself and obtained essentially the same reading.  the general appearance reveals a well-developed well-nourished pleasant woman in no distress.  She demonstrates mild memory deficit.  Her husband accompanies her to the visit today.The head and neck exam reveals pupils equal and reactive.  Extraocular movements are full.  There is no scleral icterus.  The mouth and pharynx are normal.  The neck is supple.  The carotids reveal no bruits.  The jugular venous pressure is normal.  The  thyroid is not enlarged.  There is no lymphadenopathy.  The chest is clear to percussion and auscultation.  There are no rales or rhonchi.  Expansion of the chest is symmetrical.  The precordium is quiet.  The first heart sound is normal.  The second heart sound is physiologically split.  There is no murmur gallop rub or click.  There is no abnormal lift or heave.  The abdomen is soft and nontender.  The bowel sounds are normal.  The liver and spleen are not enlarged.  There are no abdominal  masses.  There are no abdominal bruits.  Extremities reveal good pedal pulses.  There is no phlebitis or edema.  There is no cyanosis or clubbing.  Strength is normal and symmetrical in all extremities.  There is no lateralizing weakness.  There are no sensory deficits.  The skin is warm and dry.  There is no rash.   EKG today shows normal sinus rhythm and is within normal  limits   Assessment / Plan: Await results of a referral to [facility]. The patient's daughter Steward DroneBrenda was with her during today's exam.  We talked at length about considering moving to a retirement center such as [place] where she could be around people and not be alone in her big old house every night.  She will consider our recommendation.  She does have a friend who moved to [place] She will get a glucometer to settle the question of possible hypoglycemia causing her "weak troubles" Recheck here in 3 months for followup office visit

## 2013-11-24 NOTE — Telephone Encounter (Signed)
Dr. Patty SermonsBrackbill is going to see patient for ov today

## 2013-11-24 NOTE — Patient Instructions (Signed)
WILL ARRANGE FOR YOU TO SEE DR L WILLIS, NEUROLOGY  Get a glucometer and check your blood sugar during these spells you have  Keep your June appointment   STRONGLY CONSIDER MOVING TO A RETIREMENT CENTER

## 2013-11-26 NOTE — Telephone Encounter (Signed)
Called neurology office to follow up and patient has ov 5/26 at 10:30 (arrive at 10:00), advised daughter

## 2013-12-02 ENCOUNTER — Telehealth: Payer: Self-pay | Admitting: Cardiology

## 2013-12-02 NOTE — Telephone Encounter (Signed)
New message    pateint calling live by herself does not know what to do. Patient crying .

## 2013-12-02 NOTE — Telephone Encounter (Signed)
Spoke with patient twice today. When I spoke with her the she was complaining of just feeling like she was "shaking" inside. She has had spells like this in the past and was told by a doctor (she couldn't remember who) that she had issues with blood sugars dropping. She does have a glucometer at home but she does not know how to use it. Patient stated did not want to bother her daughters so she called here. Patient quickly turned the conversation to being home alone since her husband passed away in November and gets scared being there all alone. Discussed with  Dr. Patty SermonsBrackbill and no medication changes. Patient aware. Discussed with  Dr. Patty SermonsBrackbill and since patient is has been having a decline with her mental status and calling office weekly recommended calling daughter Steward DroneBrenda. Left message for Steward DroneBrenda to call back.

## 2013-12-04 NOTE — Telephone Encounter (Signed)
Left message to call back  

## 2013-12-04 NOTE — Telephone Encounter (Signed)
Follow up ° ° ° ° °Returning a call to Mary Harrison °

## 2013-12-04 NOTE — Telephone Encounter (Signed)
Spoke with daughter regarding mother. She was very appreciative and will talk with sisters to see what options may be.

## 2013-12-07 ENCOUNTER — Encounter (HOSPITAL_BASED_OUTPATIENT_CLINIC_OR_DEPARTMENT_OTHER): Payer: Self-pay | Admitting: Emergency Medicine

## 2013-12-07 ENCOUNTER — Emergency Department (HOSPITAL_BASED_OUTPATIENT_CLINIC_OR_DEPARTMENT_OTHER): Payer: Medicare Other

## 2013-12-07 ENCOUNTER — Telehealth: Payer: Self-pay | Admitting: Cardiology

## 2013-12-07 ENCOUNTER — Emergency Department (HOSPITAL_BASED_OUTPATIENT_CLINIC_OR_DEPARTMENT_OTHER)
Admission: EM | Admit: 2013-12-07 | Discharge: 2013-12-07 | Disposition: A | Discharge status: Home / Self Care | Payer: Medicare Other | Attending: Emergency Medicine | Admitting: Emergency Medicine

## 2013-12-07 DIAGNOSIS — F0390 Unspecified dementia without behavioral disturbance: Secondary | ICD-10-CM | POA: Insufficient documentation

## 2013-12-07 DIAGNOSIS — F419 Anxiety disorder, unspecified: Secondary | ICD-10-CM

## 2013-12-07 DIAGNOSIS — F411 Generalized anxiety disorder: Secondary | ICD-10-CM | POA: Insufficient documentation

## 2013-12-07 DIAGNOSIS — G43909 Migraine, unspecified, not intractable, without status migrainosus: Secondary | ICD-10-CM | POA: Insufficient documentation

## 2013-12-07 DIAGNOSIS — I10 Essential (primary) hypertension: Secondary | ICD-10-CM | POA: Insufficient documentation

## 2013-12-07 DIAGNOSIS — Z87891 Personal history of nicotine dependence: Secondary | ICD-10-CM | POA: Insufficient documentation

## 2013-12-07 DIAGNOSIS — E785 Hyperlipidemia, unspecified: Secondary | ICD-10-CM | POA: Insufficient documentation

## 2013-12-07 DIAGNOSIS — Z7982 Long term (current) use of aspirin: Secondary | ICD-10-CM | POA: Insufficient documentation

## 2013-12-07 DIAGNOSIS — R51 Headache: Secondary | ICD-10-CM | POA: Insufficient documentation

## 2013-12-07 DIAGNOSIS — R5381 Other malaise: Secondary | ICD-10-CM | POA: Insufficient documentation

## 2013-12-07 DIAGNOSIS — E7800 Pure hypercholesterolemia, unspecified: Secondary | ICD-10-CM | POA: Insufficient documentation

## 2013-12-07 DIAGNOSIS — R519 Headache, unspecified: Secondary | ICD-10-CM

## 2013-12-07 DIAGNOSIS — Z79899 Other long term (current) drug therapy: Secondary | ICD-10-CM | POA: Insufficient documentation

## 2013-12-07 DIAGNOSIS — R5383 Other fatigue: Secondary | ICD-10-CM

## 2013-12-07 LAB — BASIC METABOLIC PANEL
BUN: 7 mg/dL (ref 6–23)
CALCIUM: 9.7 mg/dL (ref 8.4–10.5)
CO2: 24 meq/L (ref 19–32)
CREATININE: 0.5 mg/dL (ref 0.50–1.10)
Chloride: 99 mEq/L (ref 96–112)
GFR calc non Af Amer: 89 mL/min — ABNORMAL LOW (ref >90)
Glucose, Bld: 103 mg/dL — ABNORMAL HIGH (ref 70–99)
Potassium: 3.1 mEq/L — ABNORMAL LOW (ref 3.7–5.3)
Sodium: 138 mEq/L (ref 137–147)

## 2013-12-07 LAB — CBC WITH DIFFERENTIAL/PLATELET
BASOS ABS: 0 10*3/uL (ref 0.0–0.1)
BASOS PCT: 0 % (ref 0–1)
EOS PCT: 1 % (ref 0–5)
Eosinophils Absolute: 0 10*3/uL (ref 0.0–0.7)
HCT: 41.3 % (ref 36.0–46.0)
HEMOGLOBIN: 14.5 g/dL (ref 12.0–15.0)
Lymphocytes Relative: 30 % (ref 12–46)
Lymphs Abs: 2 10*3/uL (ref 0.7–4.0)
MCH: 31.1 pg (ref 26.0–34.0)
MCHC: 35.1 g/dL (ref 30.0–36.0)
MCV: 88.6 fL (ref 78.0–100.0)
Monocytes Absolute: 0.9 10*3/uL (ref 0.1–1.0)
Monocytes Relative: 13 % — ABNORMAL HIGH (ref 3–12)
Neutro Abs: 3.8 10*3/uL (ref 1.7–7.7)
Neutrophils Relative %: 56 % (ref 43–77)
Platelets: 318 10*3/uL (ref 150–400)
RBC: 4.66 MIL/uL (ref 3.87–5.11)
RDW: 13.2 % (ref 11.5–15.5)
WBC: 6.7 10*3/uL (ref 4.0–10.5)

## 2013-12-07 LAB — CBG MONITORING, ED: GLUCOSE-CAPILLARY: 87 mg/dL (ref 70–99)

## 2013-12-07 MED ORDER — HYDROMORPHONE HCL PF 1 MG/ML IJ SOLN
0.5000 mg | Freq: Once | INTRAMUSCULAR | Status: AC
  Administered 2013-12-07: 0.5 mg via INTRAVENOUS
  Filled 2013-12-07: qty 1

## 2013-12-07 MED ORDER — LORAZEPAM 1 MG PO TABS: 1.0000 mg | ORAL_TABLET | Freq: Three times a day (TID) | ORAL | Status: DC | PRN

## 2013-12-07 MED ORDER — ONDANSETRON HCL 4 MG/2ML IJ SOLN
4.0000 mg | Freq: Once | INTRAMUSCULAR | Status: AC
  Administered 2013-12-07: 4 mg via INTRAVENOUS
  Filled 2013-12-07: qty 2

## 2013-12-07 MED ORDER — SODIUM CHLORIDE 0.9 % IV SOLN
INTRAVENOUS | Status: DC
  Administered 2013-12-07: 20:00:00 via INTRAVENOUS

## 2013-12-07 NOTE — Discharge Instructions (Signed)
Today's workup of any significant findings. Only to follow a regular doctor to have your blood pressure rechecked. Also agree with the followup with neurology. Trial of Ativan to help with the anxiety. Return for any new or worse symptoms.

## 2013-12-07 NOTE — Telephone Encounter (Signed)
Follow up     Want Dr Patty SermonsBrackbill to refer pt to a diabetic doctor. Strong family history for diabetes but she has not been diagnosed yet.  Daughter said this morning pt was having trembles and shakes and seemed to be having a panic attack.  This has been happening at least 3 times a week.  She sweats so much at night that she soaks thru her pajamas------changing them up to 3 times a night.

## 2013-12-07 NOTE — Telephone Encounter (Signed)
New message    Patient calling with weak tumbles .  Feels like she going to past out . Patient stated not to wait until 5:30 to call.

## 2013-12-07 NOTE — Telephone Encounter (Signed)
I agree with advice given 

## 2013-12-07 NOTE — ED Provider Notes (Signed)
CSN: [id number]     Arrival date & time 12/07/13  1752 History  This chart was scribed for Shelda Jakes, MD by Ardelia Mems, ED Scribe. This patient was seen in room MH07/MH07 and the patient's care was started at 6:35 PM.   Chief Complaint  Patient presents with  . Hypertension    Patient is a 80 y.o. female presenting with hypertension. The history is provided by the patient. No language interpreter was used.  Hypertension This is a chronic problem. The current episode started more than 1 week ago. The problem has not changed since onset.Associated symptoms include headaches. Nothing aggravates the symptoms. Nothing relieves the symptoms. She has tried nothing for the symptoms. The treatment provided no relief.    HPI Comments: Mary Harrison is a 80 y.o. Female with a history of HTN who presents to the Emergency Department complaining of HTN, noting that she has had a systolic blood pressure in the 180s today. Her blood pressure here in the ED was taken to be 200/75. She states that she has been having generalized weakness and a headache onset today. She states that she has had similar headaches in the past, which have resolved after taking BC powder. She states that she has not taken [facility] powder or any other medications today. She also states that she has been feeling jittery/nervous today. Pt was seen by her PCP 1 week ago due to having "weak trembles" and she had a normal check-up. Pt was advised to monitor her blood pressure at home after her visit last week. Pt is also concerned today that she may have low blood sugar. Pt reports that her husband passed away in 28-Nov-2014and that she suddenly became very upset about this today. She is crying during the physician-patient encounter. She states that in the past she has been prescribed Trazadone and Ambien. She notes that she is not taking any of these medications currently. Relative states that pt has an appointment with Neurologist on  12/29/13.  PCP- [name]   Past Medical History  Diagnosis Date  . Hypertension   . Hypercholesteremia   . Headache(784.0)   . Dyslipidemia   . Dementia   . Migraine   . Palpitation   . Normal nuclear stress test 2005   Past Surgical History  Procedure Laterality Date  . Abdominal hysterectomy    . Colon repair    . Adhesions removed from colon     Family History  Problem Relation Age of Onset  . Parkinsonism Mother   . Heart disease Father    History  Substance Use Topics  . Smoking status: Former Smoker    Quit date: 08/06/1968  . Smokeless tobacco: Never Used  . Alcohol Use: 0.0 oz/week    1-3 Glasses of wine per week   OB History   Grav Para Term Preterm Abortions TAB SAB Ect Mult Living                 Review of Systems  Unable to perform ROS: Dementia  Neurological: Positive for weakness and headaches.  Psychiatric/Behavioral: The patient is nervous/anxious.     Allergies  Namenda; Mevacor; Paxil; and Toprol xl  Home Medications   Prior to Admission medications   Medication Sig Start Date End Date Taking? Authorizing Provider  amLODipine (NORVASC) 10 MG tablet TAKE 1 TABLET BY MOUTH DAILY 05/21/13   Cassell Clement, MD  aspirin 325 MG tablet Take 325 mg by mouth daily.  Historical Provider, MD  atorvastatin (LIPITOR) 20 MG tablet TAKE 1/2 TABLET BY MOUTH DAILY 10/14/13   Cassell Clement, MD  B Complex Vitamins (VITAMIN B COMPLEX PO) Take by mouth. daily    Historical Provider, MD  BENICAR HCT 40-12.5 MG per tablet TAKE 1 TABLET BY MOUTH DAILY    Cassell Clement, MD  bumetanide (BUMEX) 1 MG tablet TAKE 1 TABLET BY MOUTH EVERY OTHER DAY AS NEEDED FOR FLUID    Cassell Clement, MD  Cholecalciferol (VITAMIN D) 2000 UNIT tablet Take 2,000 Units by mouth daily.      Historical Provider, MD  Clobetasol Prop Emollient Base (CLOBETASOL PROPIONATE E) 0.05 % emollient cream APPLY TO DAILY AS NEEDED 09/03/13   Cassell Clement, MD  donepezil (ARICEPT) 5  MG tablet Take 1 tablet (5 mg total) by mouth at bedtime. 09/10/13   Cassell Clement, MD  gabapentin (NEURONTIN) 300 MG capsule TAKE 1 TO 2 CAPSULES BY MOUTH EVERY NIGHT AT BEDTIME 08/10/13   Cassell Clement, MD  ibuprofen (ADVIL,MOTRIN) 800 MG tablet as needed. 12/27/11   Historical Provider, MD  magnesium 30 MG tablet Take 250 mg by mouth daily.     Historical Provider, MD  potassium chloride SA (K-DUR,KLOR-CON) 20 MEQ tablet Take 1 tablet (20 mEq total) by mouth daily. 09/04/13   Cassell Clement, MD  propranolol (INDERAL) 20 MG tablet TAKE 1 TABLET BY MOUTH TWICE DAILY 07/06/13   Cassell Clement, MD  vitamin A 16109 UNIT capsule Take 10,000 Units by mouth daily.    Historical Provider, MD  vitamin C (ASCORBIC ACID) 500 MG tablet Take 500 mg by mouth as needed.     Historical Provider, MD  vitamin E 1000 UNIT capsule Take 1,000 Units by mouth daily.    Historical Provider, MD  zolpidem (AMBIEN) 5 MG tablet Take 1 tablet (5 mg total) by mouth at bedtime as needed for sleep. 11/02/13   Cassell Clement, MD   Triage Vitals: BP 200/75  Pulse 70  Temp(Src) 98.6 F (37 C) (Oral)  Resp 16  Ht 5\' 4"  (1.626 m)  Wt 155 lb (70.308 kg)  BMI 26.59 kg/m2  SpO2 96%  Physical Exam  Nursing note and vitals reviewed. Constitutional: She is oriented to person, place, and time. She appears well-developed and well-nourished. No distress.  HENT:  Head: Normocephalic and atraumatic.  Mucous membranes are moist  Eyes: EOM are normal.  Neck: Neck supple. No tracheal deviation present.  Cardiovascular: Normal rate and regular rhythm.   Pulmonary/Chest: Effort normal and breath sounds normal. No respiratory distress. She has no wheezes. She has no rales.  Lungs CTA  Abdominal: Bowel sounds are normal. There is no tenderness.  Musculoskeletal: Normal range of motion. She exhibits no edema.  No swelling in the ankles  Neurological: She is alert and oriented to person, place, and time. No cranial nerve deficit.   Skin: Skin is warm and dry.  Psychiatric: She has a normal mood and affect. Her behavior is normal.    ED Course  Procedures (including critical care time)  DIAGNOSTIC STUDIES: Oxygen Saturation is 96% on RA, normal by my interpretation.    COORDINATION OF CARE: 6:42 PM- Discussed plan to obtain CTs of pt's head and C-spine, along with a CBC and BMP. Will also order IV fluids, Zofran and Dilaudid. Pt advised of plan for treatment and pt agrees.  Medications  0.9 %  sodium chloride infusion ( Intravenous New Bag/Given 12/07/13 1934)  ondansetron (ZOFRAN) injection 4 mg (4 mg Intravenous  Given 12/07/13 1918)  HYDROmorphone (DILAUDID) injection 0.5 mg (0.5 mg Intravenous Given 12/07/13 1919)   Labs Review Labs Reviewed  CBC WITH DIFFERENTIAL - Abnormal; Notable for the following:    Monocytes Relative 13 (*)    All other components within normal limits  BASIC METABOLIC PANEL - Abnormal; Notable for the following:    Potassium 3.1 (*)    Glucose, Bld 103 (*)    GFR calc non Af Amer 89 (*)    All other components within normal limits  CBG MONITORING, ED   Results for orders placed during the hospital encounter of 12/07/13  CBC WITH DIFFERENTIAL      Result Value Ref Range   WBC 6.7  4.0 - 10.5 K/uL   RBC 4.66  3.87 - 5.11 MIL/uL   Hemoglobin 14.5  12.0 - 15.0 g/dL   HCT 16.141.3  09.636.0 - 04.546.0 %   MCV 88.6  78.0 - 100.0 fL   MCH 31.1  26.0 - 34.0 pg   MCHC 35.1  30.0 - 36.0 g/dL   RDW 40.913.2  81.111.5 - 91.415.5 %   Platelets 318  150 - 400 K/uL   Neutrophils Relative % 56  43 - 77 %   Neutro Abs 3.8  1.7 - 7.7 K/uL   Lymphocytes Relative 30  12 - 46 %   Lymphs Abs 2.0  0.7 - 4.0 K/uL   Monocytes Relative 13 (*) 3 - 12 %   Monocytes Absolute 0.9  0.1 - 1.0 K/uL   Eosinophils Relative 1  0 - 5 %   Eosinophils Absolute 0.0  0.0 - 0.7 K/uL   Basophils Relative 0  0 - 1 %   Basophils Absolute 0.0  0.0 - 0.1 K/uL  BASIC METABOLIC PANEL      Result Value Ref Range   Sodium 138  137 - 147 mEq/L    Potassium 3.1 (*) 3.7 - 5.3 mEq/L   Chloride 99  96 - 112 mEq/L   CO2 24  19 - 32 mEq/L   Glucose, Bld 103 (*) 70 - 99 mg/dL   BUN 7  6 - 23 mg/dL   Creatinine, Ser 7.820.50  0.50 - 1.10 mg/dL   Calcium 9.7  8.4 - 95.610.5 mg/dL   GFR calc non Af Amer 89 (*) >90 mL/min   GFR calc Af Amer >90  >90 mL/min  CBG MONITORING, ED      Result Value Ref Range   Glucose-Capillary 87  70 - 99 mg/dL    Imaging Review Ct Head Wo Contrast  12/07/2013   CLINICAL DATA:  Dizziness, confusion.  Hypertension.  EXAM: CT HEAD WITHOUT CONTRAST  CT CERVICAL SPINE WITHOUT CONTRAST  TECHNIQUE: Multidetector CT imaging of the head and cervical spine was performed following the standard protocol without intravenous contrast. Multiplanar CT image reconstructions of the cervical spine were also generated.  COMPARISON:  None.  FINDINGS: CT HEAD FINDINGS  CSF spaces are within normal limits for patient age. Mild scattered white matter disease present within the supratentorial white matter. Vascular calcifications present within the distal vertebral arteries.  There is no acute intracranial hemorrhage or infarct. No mass lesion or midline shift. Gray-white matter differentiation is well maintained. Ventricles are normal in size without evidence of hydrocephalus. CSF containing spaces are within normal limits. No extra-axial fluid collection.  The calvarium is intact.  Orbital soft tissues are within normal limits.  Minimal layering opacity present within the sphenoid sinuses bilaterally. Polypoid opacity also noted  within the inferior aspect of the right maxillary sinus. Paranasal sinuses are otherwise clear. Mastoid air cells are well pneumatized and free of fluid.  Scalp soft tissues are unremarkable.  CT CERVICAL SPINE FINDINGS  The vertebral bodies are normally aligned with preservation of the normal cervical lordosis. Vertebral body heights are preserved. Normal C1-2 articulations are intact. No prevertebral soft tissue swelling. No  acute fracture or listhesis.  Moderate multilevel degenerative disc disease as evidenced by intervertebral disc space narrowing and endplate osteophytosis seen at C4-5, C5-6, and C6-7.  The left lobe of the thyroid is enlarged and heterogeneous appearance with multiple scattered calcifications like, likely related to multinodular quarter. Visualized soft tissues of the neck are otherwise unremarkable.  IMPRESSION: CT BRAIN:  1. No acute intracranial process. 2. Mild paranasal sphenoid and will right maxillary sinus disease.  CT CERVICAL SPINE:  1. No acute traumatic injury within the cervical spine. 2. Moderate degenerative disc disease at C4-5, C5-6, and C6-7. 3. Enlarged and heterogeneous left thyroid lobe with multiple scattered calcifications, likely related to multinodular goiter.   Electronically Signed   By: Rise Mu M.D.   On: 12/07/2013 20:13   Ct Cervical Spine Wo Contrast  12/07/2013   CLINICAL DATA:  Dizziness, confusion.  Hypertension.  EXAM: CT HEAD WITHOUT CONTRAST  CT CERVICAL SPINE WITHOUT CONTRAST  TECHNIQUE: Multidetector CT imaging of the head and cervical spine was performed following the standard protocol without intravenous contrast. Multiplanar CT image reconstructions of the cervical spine were also generated.  COMPARISON:  None.  FINDINGS: CT HEAD FINDINGS  CSF spaces are within normal limits for patient age. Mild scattered white matter disease present within the supratentorial white matter. Vascular calcifications present within the distal vertebral arteries.  There is no acute intracranial hemorrhage or infarct. No mass lesion or midline shift. Gray-white matter differentiation is well maintained. Ventricles are normal in size without evidence of hydrocephalus. CSF containing spaces are within normal limits. No extra-axial fluid collection.  The calvarium is intact.  Orbital soft tissues are within normal limits.  Minimal layering opacity present within the sphenoid sinuses  bilaterally. Polypoid opacity also noted within the inferior aspect of the right maxillary sinus. Paranasal sinuses are otherwise clear. Mastoid air cells are well pneumatized and free of fluid.  Scalp soft tissues are unremarkable.  CT CERVICAL SPINE FINDINGS  The vertebral bodies are normally aligned with preservation of the normal cervical lordosis. Vertebral body heights are preserved. Normal C1-2 articulations are intact. No prevertebral soft tissue swelling. No acute fracture or listhesis.  Moderate multilevel degenerative disc disease as evidenced by intervertebral disc space narrowing and endplate osteophytosis seen at C4-5, C5-6, and C6-7.  The left lobe of the thyroid is enlarged and heterogeneous appearance with multiple scattered calcifications like, likely related to multinodular quarter. Visualized soft tissues of the neck are otherwise unremarkable.  IMPRESSION: CT BRAIN:  1. No acute intracranial process. 2. Mild paranasal sphenoid and will right maxillary sinus disease.  CT CERVICAL SPINE:  1. No acute traumatic injury within the cervical spine. 2. Moderate degenerative disc disease at C4-5, C5-6, and C6-7. 3. Enlarged and heterogeneous left thyroid lobe with multiple scattered calcifications, likely related to multinodular goiter.   Electronically Signed   By: Rise Mu M.D.   On: 12/07/2013 20:13     EKG Interpretation None      MDM   Final diagnoses:  Headache  Anxiety    Patient workup is predominantly anxiety related. Workup for the headache negative  head CT. Patient was persistent hypertension we'll need to follow up with her primary care Dr. to see if there needs to be adjustments in her blood pressure meds. We'll give her a trial of Ativan to help her with the anxiety anxieties related to the death of her husband. Her daughter does have an appointment with her with neurology. She will get further evaluation air most likely will get an MRI of her brain as well.  Patient improved here with the hydromorphone headache has resolved completely patient feels better. Blood pressure did not improve significantly though. Patient without any endorgan symptoms no evidence of any renal problems no shortness of breath no severe chest pain no focal neurologic deficits nothing suggestive of a stroke.  I personally performed the services described in this documentation, which was scribed in my presence. The recorded information has been reviewed and is accurate.    Shelda JakesScott W. Mycala Warshawsky, MD 12/07/13 2116

## 2013-12-07 NOTE — ED Notes (Signed)
Pt has had intermittent htn up to 180s systolic.  Has had diffuse ha, worse in temporal area.  Has had lightheadedness and dizziness.  Denies nausea or vision disturbances.

## 2013-12-07 NOTE — Telephone Encounter (Signed)
Patient phoned complaining of a trembling/shaking feeling inside like when she has her "low blood sugars". 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 Advised patient that  Dr. Patty SermonsBrackbill out of the office and she needs to be seen while she was having these feelings. Explained that I could work her into Dr Yevonne PaxBrackbill's schedule on Wednesday when he is back in the office but she really needed to be evaluated now, recommended Urgent Care. The patient did become upset and hung up on me. Did call her daughter Mary Harrison and let her know she had called again today. Mary Harrison was aware and had spoken to her mother earlier and told her not to call here.

## 2014-01-08 ENCOUNTER — Other Ambulatory Visit: Payer: Medicare Other

## 2014-01-11 ENCOUNTER — Encounter: Payer: Self-pay | Admitting: Cardiology

## 2014-01-11 ENCOUNTER — Other Ambulatory Visit (INDEPENDENT_AMBULATORY_CARE_PROVIDER_SITE_OTHER): Payer: Medicare Other

## 2014-01-11 ENCOUNTER — Ambulatory Visit (INDEPENDENT_AMBULATORY_CARE_PROVIDER_SITE_OTHER): Payer: Medicare Other | Admitting: Cardiology

## 2014-01-11 VITALS — BP 151/81 | HR 70 | Ht 64.0 in | Wt 149.0 lb

## 2014-01-11 DIAGNOSIS — I119 Hypertensive heart disease without heart failure: Secondary | ICD-10-CM

## 2014-01-11 DIAGNOSIS — F0390 Unspecified dementia without behavioral disturbance: Secondary | ICD-10-CM

## 2014-01-11 DIAGNOSIS — E7800 Pure hypercholesterolemia, unspecified: Secondary | ICD-10-CM

## 2014-01-11 DIAGNOSIS — R002 Palpitations: Secondary | ICD-10-CM

## 2014-01-11 DIAGNOSIS — I10 Essential (primary) hypertension: Secondary | ICD-10-CM

## 2014-01-11 LAB — BASIC METABOLIC PANEL
BUN: 8 mg/dL (ref 6–23)
CHLORIDE: 98 meq/L (ref 96–112)
CO2: 28 meq/L (ref 19–32)
Calcium: 9.1 mg/dL (ref 8.4–10.5)
Creatinine, Ser: 0.5 mg/dL (ref 0.4–1.2)
GFR: 129.01 mL/min (ref >60.00)
GLUCOSE: 97 mg/dL (ref 70–99)
POTASSIUM: 3.8 meq/L (ref 3.5–5.1)
SODIUM: 133 meq/L — AB (ref 135–145)

## 2014-01-11 LAB — LIPID PANEL
CHOL/HDL RATIO: 2
Cholesterol: 146 mg/dL (ref 0–200)
HDL: 59.6 mg/dL (ref >39.00)
LDL Cholesterol: 73 mg/dL (ref 0–99)
NONHDL: 86.4
Triglycerides: 65 mg/dL (ref 0.0–149.0)
VLDL: 13 mg/dL (ref 0.0–40.0)

## 2014-01-11 LAB — HEPATIC FUNCTION PANEL
ALT: 16 U/L (ref 0–35)
AST: 20 U/L (ref 0–37)
Albumin: 3.5 g/dL (ref 3.5–5.2)
Alkaline Phosphatase: 47 U/L (ref 39–117)
BILIRUBIN TOTAL: 0.2 mg/dL (ref 0.2–1.2)
Bilirubin, Direct: 0.1 mg/dL (ref 0.0–0.3)
Total Protein: 6.1 g/dL (ref 6.0–8.3)

## 2014-01-11 NOTE — Assessment & Plan Note (Signed)
Her blood pressures gradually improving.  She has lost another 6 pounds since her last visit in April and this has helped.

## 2014-01-11 NOTE — Progress Notes (Signed)
Quick Note:  Please report to patient. The recent labs are stable. Continue same medication and careful diet. ______ 

## 2014-01-11 NOTE — Assessment & Plan Note (Signed)
The patient has been on Aricept and has been having some GI issues.  However she has not been taking it following a meal.  She's been taking at bedtime.  We discussed that it would be prudent if she took it after breakfast

## 2014-01-11 NOTE — Assessment & Plan Note (Signed)
The patient is on Lipitor.  She's not having any side effects.  We will plan to check her fasting lab work at her next visit.

## 2014-01-11 NOTE — Patient Instructions (Signed)
Your physician recommends that you continue on your current medications as directed. Please refer to the Current Medication list given to you today  Your physician recommends that you schedule a follow-up appointment in: 3 months with fasting labs (LP/BMET/HFP)  

## 2014-01-11 NOTE — Progress Notes (Signed)
Mary Harrison Date of Birth:  [birth year] [facility] [address] [place], [place]  [address] [phone]        Fax   [phone]   History of Present Illness: This pleasant 80 year old woman is seen for a scheduled followup office visit. She is a recent widow. Her husband died of sudden cardiac death at home. She has a history of atypical chest pain. She does not have any history of ischemic heart disease. She has had a previous normal nuclear stress test on 09/23/03 showing no ischemia and showing an ejection fraction of 50%. she has a history of essential hypertension, hypercholesterolemia and a problem with her memory and early dementia.  She had previously had some problems with her she describes as a "weak trembles".  She was concerned that it might be hypoglycemia.  However, the family did purchase a glucometer and have checked her multiple times during these episodes and her blood sugar has always been in the normal range.  Thus there is no evidence for hypoglycemia.  On one occasion and the family took the [facility] [place] [facility] after she had an anxiety episode at home and her systolic blood pressure went to 210.  On the monitor while she was being evaluated premature beats were noted and the patient stated that she was aware of the premature beats.  The patient has seen Dr. Anne Hahn a neurologist in [facility] who has been trying to adjust her anxiety medication and her medication for mild dementia. Current Outpatient Prescriptions  Medication Sig Dispense Refill  . amLODipine (NORVASC) 10 MG tablet TAKE 1 TABLET BY MOUTH DAILY  30 tablet  5  . aspirin 325 MG tablet Take 325 mg by mouth daily.       [name] atorvastatin (LIPITOR) 20 MG tablet TAKE 1/2 TABLET BY MOUTH DAILY  15 tablet  5  . B Complex Vitamins (VITAMIN B COMPLEX PO) Take by mouth. daily      . BENICAR HCT 40-12.5 MG per tablet TAKE 1 TABLET BY MOUTH DAILY  30 tablet  2  . bumetanide (BUMEX) 1  MG tablet TAKE 1 TABLET BY MOUTH EVERY OTHER DAY AS NEEDED FOR FLUID  30 tablet  3  . Cholecalciferol (VITAMIN D) 2000 UNIT tablet Take 2,000 Units by mouth daily.        . Clobetasol Prop Emollient Base (CLOBETASOL PROPIONATE E) 0.05 % emollient cream APPLY TO DAILY AS NEEDED  30 g  3  . donepezil (ARICEPT) 5 MG tablet Take 1 tablet (5 mg total) by mouth at bedtime.  90 tablet  1  . gabapentin (NEURONTIN) 300 MG capsule TAKE 1 TO 2 CAPSULES BY MOUTH EVERY NIGHT AT BEDTIME  60 capsule  6  . ibuprofen (ADVIL,MOTRIN) 800 MG tablet as needed.      [name] LORazepam (ATIVAN) 1 MG tablet Take 1 tablet (1 mg total) by mouth 3 (three) times daily as needed for anxiety.  15 tablet  0  . magnesium 30 MG tablet Take 250 mg by mouth daily.       . potassium chloride SA (K-DUR,KLOR-CON) 20 MEQ tablet Take 1 tablet (20 mEq total) by mouth daily.  30 tablet  10  . propranolol (INDERAL) 20 MG tablet TAKE 1 TABLET BY MOUTH TWICE DAILY  60 tablet  11  . vitamin A 14481 UNIT capsule Take 10,000 Units by mouth daily.      . vitamin C (ASCORBIC ACID) 500  MG tablet Take 500 mg by mouth as needed.       . vitamin E 1000 UNIT capsule Take 1,000 Units by mouth daily.      [name]. zolpidem (AMBIEN) 5 MG tablet Take 1 tablet (5 mg total) by mouth at bedtime as needed for sleep.  30 tablet  3  . [DISCONTINUED] memantine (NAMENDA TITRATION PAK) tablet pack 5 mg/day for =1 week; 5 mg twice daily for =1 week; 15 mg/day given in 5 mg and 10 mg separated doses for =1 week; then 10 mg twice daily  49 tablet  0   No current facility-administered medications for this visit.    Allergies  Allergen Reactions  . Namenda [Memantine Hcl] Anaphylaxis    headache  . Mevacor [Lovastatin]     Leg cramps  . Paxil [Paroxetine Hcl]     nausea  . Toprol Xl [Metoprolol Succinate] Palpitations    Patient Active Problem List   Diagnosis Date Noted  . Dyslipidemia     Priority: High  . Hypertension     Priority: Medium  . Dementia      Priority: Medium  . Insomnia 07/15/2013  . Dizziness 05/21/2013  . Weakness generalized 05/04/2011  . Palpitation 12/08/2010  . Hypercholesteremia   . Headache     History  Smoking status  . Former Smoker  . Quit date: 08/06/1968  Smokeless tobacco  . Never Used    History  Alcohol Use  . 0.0 oz/week  . 1-3 Glasses of wine per week    Family History  Problem Relation Age of Onset  . Parkinsonism Mother   . Heart disease Father     Review of Systems: Constitutional: no fever chills diaphoresis or fatigue or change in weight.  Head and neck: no hearing loss, no epistaxis, no photophobia or visual disturbance. Respiratory: No cough, shortness of breath or wheezing. Cardiovascular: No chest pain peripheral edema, palpitations. Gastrointestinal: No abdominal distention, no abdominal pain, no change in bowel habits hematochezia or melena. Genitourinary: No dysuria, no frequency, no urgency, no nocturia. Musculoskeletal:No arthralgias, no back pain, no gait disturbance or myalgias. Neurological: No dizziness, no headaches, no numbness, no seizures, no syncope, no weakness, no tremors. Hematologic: No lymphadenopathy, no easy bruising. Psychiatric: No confusion, no hallucinations, no sleep disturbance.    Physical Exam: Filed Vitals:   01/11/14 1127  BP: 151/81  Pulse: 70   the general appearance reveals a well-developed well-nourished woman in no distress.The head and neck exam reveals pupils equal and reactive.  Extraocular movements are full.  There is no scleral icterus.  The mouth and pharynx are normal.  The neck is supple.  The carotids reveal no bruits.  The jugular venous pressure is normal.  The  thyroid is not enlarged.  There is no lymphadenopathy.  The chest is clear to percussion and auscultation.  There are no rales or rhonchi.  Expansion of the chest is symmetrical.  The precordium is quiet.  The first heart sound is normal.  The second heart sound is  physiologically split.  There is no murmur gallop rub or click.  There is no abnormal lift or heave.  The abdomen is soft and nontender.  The bowel sounds are normal.  The liver and spleen are not enlarged.  There are no abdominal masses.  There are no abdominal bruits.  Extremities reveal good pedal pulses.  There is no phlebitis or edema.  There is no cyanosis or clubbing.  Strength is normal and symmetrical  in all extremities.  There is no lateralizing weakness.  There are no sensory deficits.  The skin is warm and dry.  There is no rash.    Assessment / Plan: 1. essential benign hypertension without heart failure 2. Dementia 3. situational depression 4. Hypercholesterolemia  Plan: Continue same medication.  Continue neurology followup in [facility] with Dr. wall is.  Recheck in 3 months for office visit lipid panel hepatic function panel and basal metabolic panel

## 2014-01-25 ENCOUNTER — Other Ambulatory Visit: Payer: Self-pay | Admitting: Cardiology

## 2014-01-25 DIAGNOSIS — R1013 Epigastric pain: Secondary | ICD-10-CM

## 2014-01-26 NOTE — Telephone Encounter (Signed)
Please refill prilosec refill

## 2014-02-18 ENCOUNTER — Telehealth: Payer: Self-pay | Admitting: Cardiology

## 2014-02-18 DIAGNOSIS — F329 Major depressive disorder, single episode, unspecified: Secondary | ICD-10-CM

## 2014-02-18 DIAGNOSIS — F32A Depression, unspecified: Secondary | ICD-10-CM

## 2014-02-18 DIAGNOSIS — R413 Other amnesia: Secondary | ICD-10-CM

## 2014-02-18 NOTE — Telephone Encounter (Signed)
New Message   Pt daughter called. Requests a call back to discuss if the pt's  B12 level was checked and if so can you please provide the numbers. Please call

## 2014-02-18 NOTE — Telephone Encounter (Signed)
Spoke with patients daughter and advised no B12 in Epic system. Requested B12 be done soon secondary to her memory changes and depression. Scheduled as requested.

## 2014-02-18 NOTE — Telephone Encounter (Signed)
New Message ° ° °Pt daughter called. Requests a call back to discuss if the pt's  B12 level was checked and if so can you please provide the numbers. Please call   ° °

## 2014-02-18 NOTE — Telephone Encounter (Signed)
Left message to call back  

## 2014-02-23 ENCOUNTER — Other Ambulatory Visit: Payer: Self-pay | Admitting: Cardiology

## 2014-02-24 ENCOUNTER — Other Ambulatory Visit: Payer: Self-pay | Admitting: Cardiology

## 2014-02-24 DIAGNOSIS — G4700 Insomnia, unspecified: Secondary | ICD-10-CM

## 2014-02-24 NOTE — Telephone Encounter (Signed)
Cassell Clementhomas Brackbill, MD at 01/11/2014  5:54 PM  BENICAR HCT 40-12.5 MG per tablet  TAKE 1 TABLET BY MOUTH DAILY   Your physician recommends that you continue on your current medications as directed.

## 2014-02-25 ENCOUNTER — Other Ambulatory Visit (INDEPENDENT_AMBULATORY_CARE_PROVIDER_SITE_OTHER): Payer: Medicare Other

## 2014-02-25 DIAGNOSIS — R413 Other amnesia: Secondary | ICD-10-CM

## 2014-02-25 DIAGNOSIS — F3289 Other specified depressive episodes: Secondary | ICD-10-CM

## 2014-02-25 DIAGNOSIS — F329 Major depressive disorder, single episode, unspecified: Secondary | ICD-10-CM

## 2014-02-25 DIAGNOSIS — F32A Depression, unspecified: Secondary | ICD-10-CM

## 2014-02-25 LAB — VITAMIN B12: Vitamin B-12: 800 pg/mL (ref 211–911)

## 2014-02-25 NOTE — Progress Notes (Signed)
Quick Note:  Please report to patient. The recent labs are stable. Continue same medication and careful diet. B12 is normal. ______

## 2014-04-09 ENCOUNTER — Telehealth: Payer: Self-pay | Admitting: Neurology

## 2014-04-09 NOTE — Telephone Encounter (Signed)
Faxed progress notes, lab tests, and x ray reports to [facility] on 04/09/14.

## 2014-04-28 ENCOUNTER — Telehealth: Payer: Self-pay | Admitting: Cardiology

## 2014-04-28 NOTE — Telephone Encounter (Signed)
Spoke with patient several times and she is still having a hard time dealing with the loss of her husband and requested medication be called in to help her. Discussed with  [name] and he recommended patient seeing Dr Janace Hoard psychiatrist, advised patient. Patient stated she would call her in the morning

## 2014-04-28 NOTE — Telephone Encounter (Signed)
New Message  Pt called to discuss anxiety problems. She says she had toast with peanut butter and a bannana and she gets trembles inside of her body as a vibration. Pt requests a call back to discuss,. She reports taking a b12 and a vitamin along with other medications but it does not help.  Please call

## 2014-04-28 NOTE — Telephone Encounter (Signed)
Left message to call back   Cell number unavailable

## 2014-05-03 ENCOUNTER — Other Ambulatory Visit: Payer: Self-pay | Admitting: Cardiology

## 2014-06-18 ENCOUNTER — Encounter: Payer: Self-pay | Admitting: Cardiology

## 2014-06-18 ENCOUNTER — Ambulatory Visit (INDEPENDENT_AMBULATORY_CARE_PROVIDER_SITE_OTHER): Payer: Medicare Other | Admitting: Cardiology

## 2014-06-18 ENCOUNTER — Other Ambulatory Visit (INDEPENDENT_AMBULATORY_CARE_PROVIDER_SITE_OTHER): Payer: Medicare Other | Admitting: *Deleted

## 2014-06-18 VITALS — BP 142/62 | HR 77 | Ht 64.0 in | Wt 156.0 lb

## 2014-06-18 DIAGNOSIS — F329 Major depressive disorder, single episode, unspecified: Secondary | ICD-10-CM

## 2014-06-18 DIAGNOSIS — R413 Other amnesia: Secondary | ICD-10-CM

## 2014-06-18 DIAGNOSIS — I119 Hypertensive heart disease without heart failure: Secondary | ICD-10-CM

## 2014-06-18 DIAGNOSIS — F0390 Unspecified dementia without behavioral disturbance: Secondary | ICD-10-CM

## 2014-06-18 DIAGNOSIS — I10 Essential (primary) hypertension: Secondary | ICD-10-CM

## 2014-06-18 DIAGNOSIS — E7800 Pure hypercholesterolemia, unspecified: Secondary | ICD-10-CM

## 2014-06-18 DIAGNOSIS — F32A Depression, unspecified: Secondary | ICD-10-CM

## 2014-06-18 DIAGNOSIS — E785 Hyperlipidemia, unspecified: Secondary | ICD-10-CM

## 2014-06-18 LAB — LIPID PANEL
CHOL/HDL RATIO: 3
Cholesterol: 199 mg/dL (ref 0–200)
HDL: 66.5 mg/dL (ref >39.00)
LDL CALC: 109 mg/dL — AB (ref 0–99)
NonHDL: 132.5
TRIGLYCERIDES: 118 mg/dL (ref 0.0–149.0)
VLDL: 23.6 mg/dL (ref 0.0–40.0)

## 2014-06-18 LAB — BASIC METABOLIC PANEL
BUN: 9 mg/dL (ref 6–23)
CO2: 23 mEq/L (ref 19–32)
CREATININE: 0.5 mg/dL (ref 0.4–1.2)
Calcium: 8.8 mg/dL (ref 8.4–10.5)
Chloride: 104 mEq/L (ref 96–112)
GFR: 125.9 mL/min (ref >60.00)
Glucose, Bld: 101 mg/dL — ABNORMAL HIGH (ref 70–99)
Potassium: 3.6 mEq/L (ref 3.5–5.1)
Sodium: 138 mEq/L (ref 135–145)

## 2014-06-18 LAB — HEPATIC FUNCTION PANEL
ALT: 13 U/L (ref 0–35)
AST: 15 U/L (ref 0–37)
Albumin: 3.4 g/dL — ABNORMAL LOW (ref 3.5–5.2)
Alkaline Phosphatase: 54 U/L (ref 39–117)
BILIRUBIN TOTAL: 0.8 mg/dL (ref 0.2–1.2)
Bilirubin, Direct: 0 mg/dL (ref 0.0–0.3)
Total Protein: 7 g/dL (ref 6.0–8.3)

## 2014-06-18 NOTE — Patient Instructions (Signed)
Will obtain labs today and call you with the results (LP/BEMT/HFP)  Your physician recommends that you continue on your current medications as directed. Please refer to the Current Medication list given to you today.  Your physician recommends that you schedule a follow-up appointment in: 4 months OV EKG

## 2014-06-18 NOTE — Progress Notes (Signed)
Mary Harrison Date of Birth:  09-12-1930 [facility] [address] [place], [place]  [id number] [phone]        Fax   [phone]   History of Present Illness: This pleasant 80 year old woman is seen for a scheduled followup office visit. She is a  widow. Her husband died of sudden cardiac death at home. She has a history of atypical chest pain. She does not have any history of ischemic heart disease. She has had a previous normal nuclear stress test on 09/23/03 showing no ischemia and showing an ejection fraction of 50%. she has a history of essential hypertension, hypercholesterolemia and a problem with her memory and early dementia.  Since we last saw her she has been on some additional medications from her psychiatrist Dr. Jennelle Humanottle and these medications appear to be helping her.  Current Outpatient Prescriptions  Medication Sig Dispense Refill  . amLODipine (NORVASC) 10 MG tablet TAKE 1 TABLET BY MOUTH DAILY 30 tablet 1  . aspirin 325 MG tablet Take 325 mg by mouth daily.     [name]. atorvastatin (LIPITOR) 20 MG tablet TAKE 1/2 TABLET BY MOUTH DAILY 15 tablet 5  . B Complex Vitamins (VITAMIN B COMPLEX PO) Take by mouth. daily    . BENICAR HCT 40-12.5 MG per tablet TAKE 1 TABLET BY MOUTH EVERY DAY 30 tablet 11  . Cholecalciferol (VITAMIN D) 2000 UNIT tablet Take 2,000 Units by mouth daily.      . Clobetasol Prop Emollient Base (CLOBETASOL PROPIONATE E) 0.05 % emollient cream APPLY TO DAILY AS NEEDED 30 g 3  . Diphenhydramine-APAP, sleep, (TYLENOL PM EXTRA STRENGTH PO) Take by mouth as directed.    . donepezil (ARICEPT) 5 MG tablet Take 1 tablet (5 mg total) by mouth at bedtime. 90 tablet 1  . gabapentin (NEURONTIN) 300 MG capsule TAKE 1 TO 2 CAPSULES BY MOUTH EVERY NIGHT AT BEDTIME 60 capsule 6  . ibuprofen (ADVIL,MOTRIN) 800 MG tablet as needed.    . magnesium 30 MG tablet Take 250 mg by mouth daily.     [name]. omeprazole (PRILOSEC) 40 MG capsule TAKE 1 CAPSULE BY MOUTH  EVERY DAY 30 capsule 0  . potassium chloride SA (K-DUR,KLOR-CON) 20 MEQ tablet Take 1 tablet (20 mEq total) by mouth daily. 30 tablet 10  . propranolol (INDERAL) 20 MG tablet TAKE 1 TABLET BY MOUTH TWICE DAILY 60 tablet 11  . vitamin A 4034710000 UNIT capsule Take 10,000 Units by mouth daily.    . vitamin C (ASCORBIC ACID) 500 MG tablet Take 500 mg by mouth as needed.     . [DISCONTINUED] memantine (NAMENDA TITRATION PAK) tablet pack 5 mg/day for =1 week; 5 mg twice daily for =1 week; 15 mg/day given in 5 mg and 10 mg separated doses for =1 week; then 10 mg twice daily 49 tablet 0   No current facility-administered medications for this visit.    Allergies  Allergen Reactions  . Namenda [Memantine Hcl] Anaphylaxis    headache  . Mevacor [Lovastatin]     Leg cramps  . Olanzapine     Ringing in the ears  . Paxil [Paroxetine Hcl]     nausea  . Toprol Xl [Metoprolol Succinate] Palpitations    Patient Active Problem List   Diagnosis Date Noted  . Dyslipidemia     Priority: High  . Hypertension     Priority: Medium  . Dementia     Priority: Medium  . Insomnia 07/15/2013  .  Dizziness 05/21/2013  . Weakness generalized 05/04/2011  . Palpitation 12/08/2010  . Hypercholesteremia   . Headache     History  Smoking status  . Former Smoker  . Quit date: 08/06/1968  Smokeless tobacco  . Never Used    History  Alcohol Use  . 0.0 oz/week  . 1-3 Glasses of wine per week    Family History  Problem Relation Age of Onset  . Parkinsonism Mother   . Heart disease Father     Review of Systems: Constitutional: no fever chills diaphoresis or fatigue or change in weight.  Head and neck: no hearing loss, no epistaxis, no photophobia or visual disturbance. Respiratory: No cough, shortness of breath or wheezing. Cardiovascular: No chest pain peripheral edema, palpitations. Gastrointestinal: No abdominal distention, no abdominal pain, no change in bowel habits hematochezia or  melena. Genitourinary: No dysuria, no frequency, no urgency, no nocturia. Musculoskeletal:No arthralgias, no back pain, no gait disturbance or myalgias. Neurological: No dizziness, no headaches, no numbness, no seizures, no syncope, no weakness, no tremors. Hematologic: No lymphadenopathy, no easy bruising. Psychiatric: No confusion, no hallucinations, no sleep disturbance.    Physical Exam: Filed Vitals:   06/18/14 1027  BP: 142/62  Pulse: 77   the general appearance reveals a well-developed well-nourished woman in no distress.The head and neck exam reveals pupils equal and reactive.  Extraocular movements are full.  There is no scleral icterus.  The mouth and pharynx are normal.  The neck is supple.  The carotids reveal no bruits.  The jugular venous pressure is normal.  The  thyroid is not enlarged.  There is no lymphadenopathy.  The chest is clear to percussion and auscultation.  There are no rales or rhonchi.  Expansion of the chest is symmetrical.  The precordium is quiet.  The first heart sound is normal.  The second heart sound is physiologically split.  There is no murmur gallop rub or click.  There is no abnormal lift or heave.  The abdomen is soft and nontender.  The bowel sounds are normal.  The liver and spleen are not enlarged.  There are no abdominal masses.  There are no abdominal bruits.  Extremities reveal good pedal pulses.  There is no phlebitis or edema.  There is no cyanosis or clubbing.  Strength is normal and symmetrical in all extremities.  There is no lateralizing weakness.  There are no sensory deficits.  The skin is warm and dry.  There is no rash.    Assessment / Plan: 1. essential benign hypertension without heart failure 2. Dementia 3. situational depression,improving 4. Hypercholesterolemia  Plan: Continue same medication. Recheck in 4 months for office visit and EKG

## 2014-06-18 NOTE — Assessment & Plan Note (Signed)
The patient has a history of dyslipidemia.  We are checking fasting lab work today.  She is not having any myalgias from her statin therapy.

## 2014-06-18 NOTE — Assessment & Plan Note (Signed)
Her symptoms of dementia and depression are clearly improved since last visit.  She has been less isolated.  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  The patient is the driver and states that she is pleased to "be in control"

## 2014-06-18 NOTE — Assessment & Plan Note (Signed)
Blood pressure has been slightly high on arrival but subsequent blood pressure 140/70 later in the exam.  She does have some element of whitecoat syndrome. She has not been experiencing any signs or symptoms of congestive heart failure.  No dizziness or syncope.  No regular aerobic activity.

## 2014-06-22 ENCOUNTER — Other Ambulatory Visit: Payer: Self-pay | Admitting: Cardiology

## 2014-07-04 ENCOUNTER — Other Ambulatory Visit: Payer: Self-pay | Admitting: Cardiology

## 2014-07-06 ENCOUNTER — Telehealth: Payer: Self-pay | Admitting: Cardiology

## 2014-07-06 MED ORDER — LORAZEPAM 1 MG PO TABS: ORAL_TABLET | ORAL | Status: DC

## 2014-07-06 NOTE — Telephone Encounter (Signed)
Advised that Dr. Patty SermonsBrackbill responded and will Rx Ativan 1 mg TID prn.  Will send Rx.

## 2014-07-06 NOTE — Telephone Encounter (Signed)
We can try Ativan 1 mg 3 times a day when necessary

## 2014-07-06 NOTE — Telephone Encounter (Signed)
New Msg   Patient is calling to get a prescription for her nerves, patient states Dr. Patty SermonsBrackbill is the one she calls for this since her husband has passed. Patient states she has trouble making it through the day and isn't motivated. Please contact patient at [phone].

## 2014-07-06 NOTE — Telephone Encounter (Signed)
Ms. Mary Harrison is calling stating she needs "something for my nerves" and depression. States she is "jittery" all the time.  She started crying during conversation because she" lost my husband a year ago and can't seem to get over it."  States she is no longer seeing Psychiatrist.  Was on Ativan 1 mg TID prn for anxiety but not on current med list.  Advised Dr. Patty SermonsBrackbill not in office today but will forward to him for recommendations.

## 2014-07-07 ENCOUNTER — Telehealth: Payer: Self-pay | Admitting: *Deleted

## 2014-07-07 NOTE — Telephone Encounter (Signed)
Pt received vacc @ [facility] either Sept or Oct of this year

## 2014-08-23 ENCOUNTER — Other Ambulatory Visit: Payer: Self-pay | Admitting: Cardiology

## 2014-09-01 ENCOUNTER — Other Ambulatory Visit: Payer: Self-pay | Admitting: Cardiology

## 2014-09-30 ENCOUNTER — Other Ambulatory Visit (HOSPITAL_COMMUNITY): Payer: Self-pay | Admitting: Cardiology

## 2014-09-30 DIAGNOSIS — I6523 Occlusion and stenosis of bilateral carotid arteries: Secondary | ICD-10-CM

## 2014-10-05 ENCOUNTER — Ambulatory Visit (HOSPITAL_COMMUNITY): Payer: Medicare Other | Attending: Cardiology | Admitting: *Deleted

## 2014-10-05 DIAGNOSIS — I6523 Occlusion and stenosis of bilateral carotid arteries: Secondary | ICD-10-CM | POA: Diagnosis not present

## 2014-10-05 DIAGNOSIS — R42 Dizziness and giddiness: Secondary | ICD-10-CM

## 2014-10-05 NOTE — Progress Notes (Signed)
Carotid Duplex Exam Performed 

## 2014-10-27 ENCOUNTER — Other Ambulatory Visit: Payer: Self-pay | Admitting: Cardiology

## 2014-10-27 DIAGNOSIS — F419 Anxiety disorder, unspecified: Secondary | ICD-10-CM

## 2014-10-27 MED ORDER — LORAZEPAM 1 MG PO TABS: ORAL_TABLET | ORAL | Status: DC

## 2014-10-27 NOTE — Telephone Encounter (Signed)
Left message to call back  

## 2014-10-27 NOTE — Telephone Encounter (Signed)
Spoke with patient and she is still having a difficult time with the passing of her husband Has days that are very difficult and would like to have something on hand to take during these times Discussed with  Dr. Patty SermonsBrackbill and will refill her Ativan Advised patient

## 2014-10-27 NOTE — Telephone Encounter (Signed)
F/u ° ° °Pt returning your call °

## 2014-10-27 NOTE — Telephone Encounter (Signed)
New message      Pt is crying.  She want something for her nerves called [facility] to [facility] [address]  She does not want something to help her sleep, she needs something for the day.  She says she lost her husband a year ago.

## 2014-12-07 ENCOUNTER — Ambulatory Visit (INDEPENDENT_AMBULATORY_CARE_PROVIDER_SITE_OTHER): Payer: Medicare Other | Admitting: Cardiology

## 2014-12-07 ENCOUNTER — Encounter: Payer: Self-pay | Admitting: Cardiology

## 2014-12-07 VITALS — BP 150/68 | HR 95 | Ht 64.0 in | Wt 162.8 lb

## 2014-12-07 DIAGNOSIS — I119 Hypertensive heart disease without heart failure: Secondary | ICD-10-CM | POA: Diagnosis not present

## 2014-12-07 DIAGNOSIS — I6523 Occlusion and stenosis of bilateral carotid arteries: Secondary | ICD-10-CM | POA: Diagnosis not present

## 2014-12-07 DIAGNOSIS — J029 Acute pharyngitis, unspecified: Secondary | ICD-10-CM | POA: Diagnosis not present

## 2014-12-07 DIAGNOSIS — E785 Hyperlipidemia, unspecified: Secondary | ICD-10-CM

## 2014-12-07 MED ORDER — AMOXICILLIN 500 MG PO CAPS: 500.0000 mg | ORAL_CAPSULE | Freq: Two times a day (BID) | ORAL | Status: DC

## 2014-12-07 NOTE — Patient Instructions (Signed)
Medication Instructions:  START AMOXIL 500 MG TWICE A DAY   Labwork: NONE   Testing/Procedures: NONE  Follow-Up: Your physician recommends that you schedule a follow-up appointment in: 4 months with fasting labs (lp/bmet/hfp)   Any Other Special Instructions Will Be Listed Below (If Applicable).

## 2014-12-07 NOTE — Progress Notes (Signed)
Cardiology Office Note   Date:  12/07/2014   ID:  Mary LeuBetty G Trueba, DOB September 22, 1930, MRN [id number]  PCP:  Ronny FlurryBrackbill, Tom, MD  Cardiologist: Cassell Clementhomas Kais Monje MD  No chief complaint on file.     History of Present Illness: Mary Harrison is a 81 y.o. female who presents for follow-up office visit  . She is a widow. Her husband died of sudden cardiac death at home. She has a history of atypical chest pain. She does not have any history of ischemic heart disease. She has had a previous normal nuclear stress test on 09/23/03 showing no ischemia and showing an ejection fraction of 50%. she has a history of essential hypertension, hypercholesterolemia and a problem with her memory and early dementia. She has seen Dr. Jennelle Humanottle in the past but has not seen him recently. Since we last saw her she has developed a severe sore throat in the last 2 days.  The right side of her throat is very sore.  No known exposure to pharyngitis   Past Medical History  Diagnosis Date  . Hypertension   . Hypercholesteremia   . Headache(784.0)   . Dyslipidemia   . Dementia   . Migraine   . Palpitation   . Normal nuclear stress test 2005    Past Surgical History  Procedure Laterality Date  . Abdominal hysterectomy    . Colon repair    . Adhesions removed from colon       Current Outpatient Prescriptions  Medication Sig Dispense Refill  . amLODipine (NORVASC) 10 MG tablet TAKE 1 TABLET BY MOUTH EVERY DAY 30 tablet 1  . aspirin 325 MG tablet Take 325 mg by mouth daily.     [name]. atorvastatin (LIPITOR) 20 MG tablet TAKE 1/2 TABLET BY MOUTH ONCE DAILY 15 tablet 5  . B Complex Vitamins (VITAMIN B COMPLEX PO) Take 1 tablet by mouth daily. daily    . BENICAR HCT 40-12.5 MG per tablet TAKE 1 TABLET BY MOUTH EVERY DAY 30 tablet 11  . Cholecalciferol (VITAMIN D) 2000 UNIT tablet Take 2,000 Units by mouth daily.      . Diphenhydramine-APAP, sleep, (TYLENOL PM EXTRA STRENGTH PO) Take 1 tablet by mouth daily as  needed (for sleep).     . donepezil (ARICEPT) 5 MG tablet Take 1 tablet (5 mg total) by mouth at bedtime. 90 tablet 1  . gabapentin (NEURONTIN) 300 MG capsule TAKE 1 TO 2 CAPSULES BY MOUTH EVERY NIGHT AT BEDTIME 60 capsule 6  . guaiFENesin (MUCINEX) 600 MG 12 hr tablet Take 600 mg by mouth 2 (two) times daily.    [name]. LORazepam (ATIVAN) 1 MG tablet Take 1 tablet every 8 hrs as needed for anxiety 30 tablet 1  . magnesium 30 MG tablet Take 250 mg by mouth daily.     [name]. omeprazole (PRILOSEC) 40 MG capsule TAKE ONE CAPSULE BY MOUTH DAILY 30 capsule 2  . potassium chloride SA (K-DUR,KLOR-CON) 20 MEQ tablet Take 1 tablet (20 mEq total) by mouth daily. 30 tablet 10  . propranolol (INDERAL) 20 MG tablet TAKE 1 TABLET BY MOUTH TWICE DAILY 60 tablet 1  . vitamin A 4098110000 UNIT capsule Take 10,000 Units by mouth daily.    . vitamin C (ASCORBIC ACID) 500 MG tablet Take 500 mg by mouth as needed.     [name]. amoxicillin (AMOXIL) 500 MG capsule Take 1 capsule (500 mg total) by mouth 2 (two) times daily. 20 capsule 0  . [DISCONTINUED] memantine (NAMENDA  TITRATION PAK) tablet pack 5 mg/day for =1 week; 5 mg twice daily for =1 week; 15 mg/day given in 5 mg and 10 mg separated doses for =1 week; then 10 mg twice daily 49 tablet 0   No current facility-administered medications for this visit.    Allergies:   Namenda; Mevacor; Olanzapine; Paxil; and Toprol xl    Social History:  The patient  reports that she quit smoking about 46 years ago. She has never used smokeless tobacco. She reports that she drinks alcohol. She reports that she does not use illicit drugs.   Family History:  The patient's family history includes Heart disease in her father; Parkinsonism in her mother.    ROS:  Please see the history of present illness.   Otherwise, review of systems are positive for none.   All other systems are reviewed and negative.    PHYSICAL EXAM: VS:  BP 150/68 mmHg  Pulse 95  Ht  (1.626 m)  Wt 162 lb 12.8 oz  (73.846 kg)  BMI 27.93 kg/m2 , BMI Body mass index is 27.93 kg/(m^2). GEN: Well nourished, well developed, in no acute distress HEENT: normal.  The pharynx is erythematous.  She has right submandibular lymphadenopathy which is tender.  The appearance suggestive of strep throat. Neck: no JVD, carotid bruits, or masses Cardiac: RRR; no murmurs, rubs, or gallops,no edema  Respiratory:  clear to auscultation bilaterally, normal work of breathing GI: soft, nontender, nondistended, + BS MS: no deformity or atrophy Skin: warm and dry, no rash Neuro:  Strength and sensation are intact Psych: euthymic mood, full affect   EKG:  EKG is ordered today. The ekg ordered today demonstrates normal sinus rhythm.  Minor nonspecific T-wave flattening new since 11/24/13   Recent Labs: 12/07/2013: Hemoglobin 14.5; Platelets 318 06/18/2014: ALT 13; BUN 9; Creatinine 0.5; Potassium 3.6; Sodium 138    Lipid Panel    Component Value Date/Time   CHOL 199 06/18/2014 1027   TRIG 118.0 06/18/2014 1027   HDL 66.50 06/18/2014 1027   CHOLHDL 3 06/18/2014 1027   VLDL 23.6 06/18/2014 1027   LDLCALC 109* 06/18/2014 1027   LDLDIRECT 124.4 09/03/2013 1057      Wt Readings from Last 3 Encounters:  12/07/14 162 lb 12.8 oz (73.846 kg)  06/18/14 156 lb (70.761 kg)  01/11/14 149 lb (67.586 kg)      Other studies Reviewed:   ASSESSMENT AND PLAN:  1. essential benign hypertension without heart failure 2. Dementia 3. situational depression,improving 4. Hypercholesterolemia 5. Acute pharyngitis.  Plan: Continue same medication. Recheck in 4 months for office visit and fasting lipid panel hepatic function panel and basal metabolic panel   Current medicines are reviewed at length with the patient today.  The patient does not have concerns regarding medicines.  The following changes have been made:  We will treat her pharyngitis with amoxicillin 500 mg twice a day for 10 days.  She will also take generic  Mucinex  Labs/ tests ordered today include:   Orders Placed This Encounter  Procedures  . Lipid panel  . Hepatic function panel  . Basic metabolic panel  . EKG 12-Lead       Signed, Cassell Clement MD 12/07/2014 6:59 PM    [facility] [address] [place], [place], [place]  [address] Phone: [phone]; Fax: [phone]

## 2014-12-27 ENCOUNTER — Other Ambulatory Visit: Payer: Self-pay | Admitting: Cardiology

## 2014-12-27 DIAGNOSIS — G629 Polyneuropathy, unspecified: Secondary | ICD-10-CM

## 2014-12-31 ENCOUNTER — Other Ambulatory Visit: Payer: Self-pay | Admitting: Cardiology

## 2015-01-08 ENCOUNTER — Other Ambulatory Visit: Payer: Self-pay | Admitting: Cardiology

## 2015-03-13 ENCOUNTER — Other Ambulatory Visit: Payer: Self-pay | Admitting: Cardiology

## 2015-03-15 ENCOUNTER — Other Ambulatory Visit: Payer: Self-pay | Admitting: Cardiology

## 2015-04-04 ENCOUNTER — Telehealth: Payer: Self-pay | Admitting: Cardiology

## 2015-04-04 DIAGNOSIS — R413 Other amnesia: Secondary | ICD-10-CM

## 2015-04-04 NOTE — Telephone Encounter (Signed)
Left message to call back  

## 2015-04-04 NOTE — Telephone Encounter (Signed)
New message     Pt is wanting to know if she could get a prescription for ativan for her nerves Please call to discuss

## 2015-04-05 NOTE — Telephone Encounter (Signed)
Follow up     Daughter is wanting a referral to a neurologist that specializes in dementia Please call to discuss

## 2015-04-05 NOTE — Telephone Encounter (Signed)
Spoke with patient and daughter Will forward to  [name] for recommendation on neurologist specializing in dementia

## 2015-04-06 NOTE — Telephone Encounter (Signed)
Dr. Anne Hahn would be a good choice

## 2015-04-07 NOTE — Telephone Encounter (Signed)
Advised daughter 

## 2015-04-17 ENCOUNTER — Other Ambulatory Visit: Payer: Self-pay | Admitting: Cardiology

## 2015-04-19 ENCOUNTER — Telehealth: Payer: Self-pay | Admitting: Cardiology

## 2015-04-19 NOTE — Telephone Encounter (Signed)
New Message  Pt daughter calling to speak w/ Rn concerning ongoing medication. Pt daughter needs to know what to do going forward. Please call back and discuss.

## 2015-04-19 NOTE — Telephone Encounter (Signed)
Mary Harrison returned call.  This daughter isn't on her HIPPA form.  Advised that she would need to have her mother to call and include her on the form before we can discuss any of her mothers medical information.

## 2015-04-19 NOTE — Telephone Encounter (Signed)
LMTCB 04-19-15 

## 2015-05-11 ENCOUNTER — Encounter: Payer: Self-pay | Admitting: Neurology

## 2015-05-11 ENCOUNTER — Other Ambulatory Visit: Payer: Self-pay | Admitting: Neurology

## 2015-05-11 ENCOUNTER — Ambulatory Visit (INDEPENDENT_AMBULATORY_CARE_PROVIDER_SITE_OTHER): Payer: Medicare Other | Admitting: Neurology

## 2015-05-11 VITALS — BP 183/85 | HR 79 | Ht 64.0 in | Wt 165.0 lb

## 2015-05-11 DIAGNOSIS — I6523 Occlusion and stenosis of bilateral carotid arteries: Secondary | ICD-10-CM

## 2015-05-11 DIAGNOSIS — G44221 Chronic tension-type headache, intractable: Secondary | ICD-10-CM | POA: Diagnosis not present

## 2015-05-11 DIAGNOSIS — R413 Other amnesia: Secondary | ICD-10-CM

## 2015-05-11 HISTORY — DX: Other amnesia: R41.3

## 2015-05-11 MED ORDER — DONEPEZIL HCL 10 MG PO TABS: 10.0000 mg | ORAL_TABLET | Freq: Every day | ORAL | Status: DC

## 2015-05-11 MED ORDER — TIZANIDINE HCL 2 MG PO TABS: 2.0000 mg | ORAL_TABLET | Freq: Two times a day (BID) | ORAL | Status: DC

## 2015-05-11 NOTE — Patient Instructions (Signed)
   We will increase the Aricept for the memory to 10 mg at night. I will try low dose tizanidine for the daily headache.  Begin aricept 10 mg at night. Look out for side effects that may include nausea, diarrhea, weight loss, or stomach cramps. This medication will also cause a runny nose, therefore there is no need for allergy medications for this purpose.

## 2015-05-11 NOTE — Progress Notes (Signed)
Reason for visit: Memory disturbance  Referring physician: Dr. Octavia Harrison is a 81 y.o. female  History of present illness:  Mary Harrison is an 81 year old right-handed white female with a history of a memory disturbance that has been present for number of years. The patient was seen previously by Dr. Sandria Manly initially in 21-Dec-2008, the last visit was in 2010-12-22. Apparently, the patient has reported some issues with memory since Dec 22, 2007. Her husband died about 2 years ago, and the patient is now living alone. She has two daughters that live in this area. The patient still operates a motor vehicle, she denies any issues with getting lost or any safety issues with driving. The patient had to give up doing her finances as she was making errors. The patient is able to keep up with her own medications and appointments, she has a lot of short-term memory problems, and some mild word finding problems. She also reports chronic daily headaches that are in the temporal regions and back of the head, associated with some crepitus in the neck. The patient is able to sleep fairly well. The patient takes [facility] powders on a regular basis, up to 3 or 4 day. The previous note in 12-21-2008 by Dr. love indicates that the patient was taking up to 6 BC powders a day at that time. The headaches date back for greater than 10 years. The patient has a headache every day. She reports no numbness or weakness of the extremities, she denies any significant balance issues or difficulty controlling the bowels or the bladder. In the past, the patient has undergone MRI of the brain in 2008/12/21 for the memory issues which showed mild small vessel disease. Blood work done at that time was also unremarkable that included a thyroid profile, B12 level, and RPR. The patient is on Aricept taking 5 mg daily. She returns to this office for an evaluation.  Past Medical History  Diagnosis Date  . Hypertension   . Hypercholesteremia   . Headache(784.0)    . Dyslipidemia   . Dementia   . Migraine   . Palpitation   . Normal nuclear stress test 22-Dec-2003  . Depression   . Memory deficits 05/11/2015    Past Surgical History  Procedure Laterality Date  . Abdominal hysterectomy    . Colon repair    . Adhesions removed from colon      Family History  Problem Relation Age of Onset  . Parkinsonism Mother   . Migraines Mother   . Heart disease Father   . Alcohol abuse Brother   . Alcohol abuse Brother   . Alcohol abuse Brother   . Migraines Sister   . Seizures Sister     Social history:  reports that she quit smoking about 46 years ago. She has never used smokeless tobacco. She reports that she drinks about 0.6 oz of alcohol per week. She reports that she does not use illicit drugs.  Medications:  Prior to Admission medications   Medication Sig Start Date End Date Taking? Authorizing Provider  amLODipine (NORVASC) 10 MG tablet TAKE 1 TABLET BY MOUTH EVERY DAY 12/28/14  Yes Cassell Clement, MD  aspirin 325 MG tablet Take 325 mg by mouth daily.    Yes Historical Provider, MD  atorvastatin (LIPITOR) 20 MG tablet TAKE 1/2 TABLET BY MOUTH EVERY DAY 04/18/15  Yes Cassell Clement, MD  B Complex Vitamins (VITAMIN B COMPLEX PO) Take 1 tablet by mouth daily. daily  Yes Historical Provider, MD  BENICAR HCT 40-12.5 MG per tablet TAKE 1 TABLET BY MOUTH EVERY DAY 03/16/15  Yes Cassell Clement, MD  Cholecalciferol (VITAMIN D) 2000 UNIT tablet Take 2,000 Units by mouth daily.     Yes Historical Provider, MD  Diphenhydramine-APAP, sleep, (TYLENOL PM EXTRA STRENGTH PO) Take 1 tablet by mouth daily as needed (for sleep).    Yes Historical Provider, MD  donepezil (ARICEPT) 5 MG tablet Take 1 tablet (5 mg total) by mouth at bedtime. 09/10/13  Yes Cassell Clement, MD  gabapentin (NEURONTIN) 300 MG capsule TAKE 1 TO 2 CAPSULES BY MOUTH EVERY NIGHT AT BEDTIME 12/28/14  Yes Cassell Clement, MD  magnesium 30 MG tablet Take 250 mg by mouth daily.    Yes Historical  Provider, MD  omeprazole (PRILOSEC) 40 MG capsule TAKE 1 CAPSULE BY MOUTH EVERY DAY 03/14/15  Yes Cassell Clement, MD  potassium chloride SA (K-DUR,KLOR-CON) 20 MEQ tablet Take 1 tablet (20 mEq total) by mouth daily. 09/04/13  Yes Cassell Clement, MD  propranolol (INDERAL) 20 MG tablet TAKE 1 TABLET BY MOUTH TWICE DAILY 01/10/15  Yes Cassell Clement, MD  vitamin A [address] UNIT capsule Take 10,000 Units by mouth daily.   Yes Historical Provider, MD  vitamin C (ASCORBIC ACID) 500 MG tablet Take 500 mg by mouth as needed.    Yes Historical Provider, MD      Allergies  Allergen Reactions  . Namenda [Memantine Hcl] Anaphylaxis    headache  . Mevacor [Lovastatin]     Leg cramps  . Olanzapine     Ringing in the ears  . Paxil [Paroxetine Hcl]     nausea  . Toprol Xl [Metoprolol Succinate] Palpitations    ROS:  Out of a complete 14 system review of symptoms, the patient complains only of the following symptoms, and all other reviewed systems are negative.  Palpitations of the heart Ringing in the ears Joint pain Memory loss, confusion, headache Anxiety Insomnia, sleepiness  Blood pressure 183/85, pulse 79, height 5\' 4"  (1.626 m), weight 165 lb (74.844 kg).  Physical Exam  General: The patient is alert and cooperative at the time of the examination.  Eyes: Pupils are equal, round, and reactive to light. Discs are flat bilaterally.  Neck: The neck is supple, no carotid bruits are noted.  Respiratory: The respiratory examination is clear.  Cardiovascular: The cardiovascular examination reveals a regular rate and rhythm, no obvious murmurs or rubs are noted.  Skin: Extremities are without significant edema.  Neurologic Exam  Mental status: The patient is alert and oriented x 2 at the time of the examination (the patient is not oriented to date). The Mini-Mental Status Examination done today shows a total score 26/30. The patient is able to name 8 animals in 30 seconds.  Cranial  nerves: Facial symmetry is present. There is good sensation of the face to pinprick and soft touch bilaterally. The strength of the facial muscles and the muscles to head turning and shoulder shrug are normal bilaterally. Speech is well enunciated, no aphasia or dysarthria is noted. Extraocular movements are full. Visual fields are full. The tongue is midline, and the patient has symmetric elevation of the soft palate. No obvious hearing deficits are noted.  Motor: The motor testing reveals 5 over 5 strength of all 4 extremities. Good symmetric motor tone is noted throughout.  Sensory: Sensory testing is intact to pinprick, soft touch, vibration sensation, and position sense on all 4 extremities. No evidence of extinction is  noted.  Coordination: Cerebellar testing reveals good finger-nose-finger and heel-to-shin bilaterally.  Gait and station: Gait is normal. Tandem gait is normal. Romberg is negative. No drift is seen.  Reflexes: Deep tendon reflexes are symmetric and normal bilaterally. Toes are downgoing bilaterally.   Assessment/Plan:  1. Progressive memory disturbance  2. Chronic daily headache  The patient appears to have a tension-type headache, possibly a cervicogenic headache or rebound headache. The patient is mainly concerned with her headache, not as much with her memory. I will start low-dose tizanidine for this 2 mg twice daily. The Aricept can be increased to 10 mg at night. The patient will follow-up in 6 months, sooner if needed. The patient will need to have her driving scrutinized closely, she may need to give up driving soon. She may need increasing levels of supervision as time goes on. The patient has had a very slow progression of her memory issues over time. The patient appears to be overusing BC powders, cutting back on this medication may help her headaches in the long run.  Marlan Palau MD 05/11/2015 6:09 PM  [facility] [address]  [address] [place], [place] [address]  Phone [phone] Fax [phone]

## 2015-05-11 NOTE — Telephone Encounter (Signed)
Okay to send 90 day supply with 1 refill?

## 2015-05-26 ENCOUNTER — Telehealth: Payer: Self-pay | Admitting: Cardiology

## 2015-05-26 NOTE — Telephone Encounter (Signed)
Spoke with patient and she has her bottles confused and medications mixed  Advised patient to take her bottles to the pharmacy to have them help sort her medications out

## 2015-05-26 NOTE — Telephone Encounter (Signed)
New Message   Pt daughter states that mother told her that Dr.Brackbill refused to see pt   If this is true she is asking what are they to do about medications? And who are they going to recommend her mother to see?

## 2015-05-26 NOTE — Telephone Encounter (Signed)
New message     Why did Dr Patty SermonsBrackbill take pt off benicar?  What did he put her on to take the benicar's place?

## 2015-05-27 NOTE — Telephone Encounter (Signed)
Spoke with daughter and advised  Dr. Patty SermonsBrackbill retiring and cardiac needs would continue to be addressed at this office but she would need to find PCP. Daughter appreciated call back

## 2015-07-15 ENCOUNTER — Other Ambulatory Visit: Payer: Self-pay | Admitting: Cardiology

## 2015-08-03 ENCOUNTER — Encounter: Payer: Self-pay | Admitting: *Deleted

## 2015-08-09 ENCOUNTER — Other Ambulatory Visit: Payer: Self-pay | Admitting: Cardiology

## 2015-08-31 ENCOUNTER — Other Ambulatory Visit: Payer: Self-pay | Admitting: Cardiology

## 2015-11-14 ENCOUNTER — Encounter: Payer: Self-pay | Admitting: Neurology

## 2015-11-14 ENCOUNTER — Ambulatory Visit (INDEPENDENT_AMBULATORY_CARE_PROVIDER_SITE_OTHER): Payer: Medicare Other | Admitting: Neurology

## 2015-11-14 VITALS — BP 146/78 | HR 72 | Ht 64.0 in | Wt 169.5 lb

## 2015-11-14 DIAGNOSIS — R413 Other amnesia: Secondary | ICD-10-CM | POA: Diagnosis not present

## 2015-11-14 DIAGNOSIS — G44221 Chronic tension-type headache, intractable: Secondary | ICD-10-CM | POA: Diagnosis not present

## 2015-11-14 MED ORDER — DONEPEZIL HCL 10 MG PO TABS: 10.0000 mg | ORAL_TABLET | Freq: Every day | ORAL | Status: DC

## 2015-11-14 NOTE — Progress Notes (Signed)
Reason for visit: Memory disturbance  Mary Harrison is an 82 y.o. female  History of present illness:  Mary Harrison is an 82 year old right-handed white female with a history of a mild memory disturbance. The patient indicates that she has been relatively stable with this. The patient operates a motor vehicle without difficulty and keeps up with her medications and appointments. She is on Aricept 10 mg daily, tolerating the medication well. She has chronic daily headaches that are usually a mild soreness sensation inside her head, occasionally she may get a pounding sensation and she will take [facility] powders for this with improvement. She does not take these on a daily basis, possibly once or twice a week. The patient drinks coffee with half the caffeine, she drinks decaf tea. She has problems with sleeping at night, she takes Tylenol PM for sleep. She returns to the office today for an evaluation. Tizanidine resulted in problems with nightmares, she stopped the medication.  Past Medical History  Diagnosis Date  . Hypertension   . Hypercholesteremia   . Headache(784.0)   . Dyslipidemia   . Dementia   . Migraine   . Palpitation   . Normal nuclear stress test 2005  . Depression   . Memory deficits 05/11/2015    Past Surgical History  Procedure Laterality Date  . Abdominal hysterectomy    . Colon repair    . Adhesions removed from colon      Family History  Problem Relation Age of Onset  . Parkinsonism Mother   . Migraines Mother   . Heart disease Father   . Alcohol abuse Brother   . Alcohol abuse Brother   . Alcohol abuse Brother   . Migraines Sister   . Seizures Sister     Social history:  reports that she quit smoking about 47 years ago. She has never used smokeless tobacco. She reports that she drinks about 0.6 oz of alcohol per week. She reports that she does not use illicit drugs.    Allergies  Allergen Reactions  . Namenda [Memantine Hcl] Anaphylaxis    headache    . Mevacor [Lovastatin]     Leg cramps  . Olanzapine     Ringing in the ears  . Paxil [Paroxetine Hcl]     nausea  . Toprol Xl [Metoprolol Succinate] Palpitations    Medications:  Prior to Admission medications   Medication Sig Start Date End Date Taking? Authorizing Provider  amLODipine (NORVASC) 10 MG tablet TAKE 1 TABLET BY MOUTH EVERY DAY 08/09/15  Yes Cassell Clement, MD  aspirin 325 MG tablet Take 325 mg by mouth daily.    Yes Historical Provider, MD  atorvastatin (LIPITOR) 20 MG tablet TAKE 1/2 TABLET BY MOUTH EVERY DAY 04/18/15  Yes Cassell Clement, MD  B Complex Vitamins (VITAMIN B COMPLEX PO) Take 1 tablet by mouth daily. daily   Yes Historical Provider, MD  BENICAR HCT 40-12.5 MG tablet TAKE 1 TABLET BY MOUTH EVERY DAY 08/31/15  Yes Cassell Clement, MD  carvedilol (COREG CR) 20 MG 24 hr capsule Take 20 mg by mouth. 09/14/15 09/13/16 Yes Historical Provider, MD  Cholecalciferol (VITAMIN D) 2000 UNIT tablet Take 2,000 Units by mouth daily.     Yes Historical Provider, MD  Diphenhydramine-APAP, sleep, (TYLENOL PM EXTRA STRENGTH PO) Take 1 tablet by mouth daily as needed (for sleep).    Yes Historical Provider, MD  donepezil (ARICEPT) 10 MG tablet Take 1 tablet (10 mg total) by mouth at bedtime.  05/11/15  Yes Mary Spanielharles K Willis, MD  gabapentin (NEURONTIN) 300 MG capsule TAKE 1 TO 2 CAPSULES BY MOUTH EVERY NIGHT AT BEDTIME 12/28/14  Yes Cassell Clementhomas Brackbill, MD  magnesium 30 MG tablet Take 250 mg by mouth daily.    Yes Historical Provider, MD  omeprazole (PRILOSEC) 40 MG capsule TAKE 1 CAPSULE BY MOUTH EVERY DAY 08/09/15  Yes Cassell Clementhomas Brackbill, MD  potassium chloride SA (K-DUR,KLOR-CON) 20 MEQ tablet TAKE 1 TABLET BY MOUTH EVERY DAY 07/15/15  Yes Cassell Clementhomas Brackbill, MD  propranolol (INDERAL) 20 MG tablet TAKE 1 TABLET BY MOUTH TWICE DAILY 01/10/15  Yes Cassell Clementhomas Brackbill, MD  tiZANidine (ZANAFLEX) 2 MG tablet TAKE 1 TABLET(2 MG) BY MOUTH TWICE DAILY 05/11/15  Yes Mary Spanielharles K Willis, MD  vitamin A [id number] UNIT  capsule Take 10,000 Units by mouth daily.   Yes Historical Provider, MD  vitamin C (ASCORBIC ACID) 500 MG tablet Take 500 mg by mouth as needed.    Yes Historical Provider, MD    ROS:  Out of a complete 14 system review of symptoms, the patient complains only of the following symptoms, and all other reviewed systems are negative.  Memory disturbance Headache  Blood pressure 146/78, pulse 72, height 5\' 4"  (1.626 m), weight 169 lb 8 oz (76.885 kg).  Physical Exam  General: The patient is alert and cooperative at the time of the examination.  Skin: No significant peripheral edema is noted.   Neurologic Exam  Mental status: The patient is alert and oriented x 2 at the time of the examination (not oriented to date). The Mini-Mental Status Examination done today shows a total score 26/30. The patient is able to name 9 animals in 30 seconds.   Cranial nerves: Facial symmetry is present. Speech is normal, no aphasia or dysarthria is noted. Extraocular movements are full. Visual fields are full.  Motor: The patient has good strength in all 4 extremities.  Sensory examination: Soft touch sensation is symmetric on the face, arms, and legs.  Coordination: The patient has good finger-nose-finger and heel-to-shin bilaterally.  Gait and station: The patient has a normal gait. Tandem gait is unsteady. Romberg is negative. No drift is seen.  Reflexes: Deep tendon reflexes are symmetric.   Assessment/Plan:  1. Chronic daily headache  2. Memory disturbance  The patient will remain on the Aricept. A prescription was called in for her. She will follow-up in 8 or 9 months. She will contact our office if questions arise.  Marlan Palau. Keith Willis MD 11/14/2015 7:55 PM  [facility] [address] [place], [place] [id number]  Phone [phone] Fax [phone]

## 2015-12-15 ENCOUNTER — Telehealth: Payer: Self-pay

## 2015-12-15 NOTE — Telephone Encounter (Signed)
Called and spoke to pt and she stated she does not need refills. She gets hers from her PCP Dr Doreene BurkeKremer

## 2016-03-16 ENCOUNTER — Encounter (HOSPITAL_COMMUNITY): Payer: Self-pay

## 2016-03-16 ENCOUNTER — Emergency Department (HOSPITAL_COMMUNITY): Payer: Medicare Other

## 2016-03-16 ENCOUNTER — Emergency Department (HOSPITAL_COMMUNITY)
Admission: EM | Admit: 2016-03-16 | Discharge: 2016-03-16 | Disposition: A | Discharge status: Home / Self Care | Payer: Medicare Other | Attending: Emergency Medicine | Admitting: Emergency Medicine

## 2016-03-16 DIAGNOSIS — Z87891 Personal history of nicotine dependence: Secondary | ICD-10-CM | POA: Diagnosis not present

## 2016-03-16 DIAGNOSIS — I10 Essential (primary) hypertension: Secondary | ICD-10-CM | POA: Diagnosis present

## 2016-03-16 DIAGNOSIS — Z79899 Other long term (current) drug therapy: Secondary | ICD-10-CM | POA: Insufficient documentation

## 2016-03-16 DIAGNOSIS — Z7982 Long term (current) use of aspirin: Secondary | ICD-10-CM | POA: Insufficient documentation

## 2016-03-16 DIAGNOSIS — R51 Headache: Secondary | ICD-10-CM | POA: Diagnosis not present

## 2016-03-16 LAB — CBC WITH DIFFERENTIAL/PLATELET
BASOS PCT: 0 %
Basophils Absolute: 0 10*3/uL (ref 0.0–0.1)
EOS ABS: 0.1 10*3/uL (ref 0.0–0.7)
Eosinophils Relative: 1 %
HEMATOCRIT: 39.8 % (ref 36.0–46.0)
HEMOGLOBIN: 13.7 g/dL (ref 12.0–15.0)
LYMPHS ABS: 1.3 10*3/uL (ref 0.7–4.0)
Lymphocytes Relative: 21 %
MCH: 31.3 pg (ref 26.0–34.0)
MCHC: 34.4 g/dL (ref 30.0–36.0)
MCV: 90.9 fL (ref 78.0–100.0)
MONO ABS: 0.8 10*3/uL (ref 0.1–1.0)
MONOS PCT: 13 %
NEUTROS PCT: 65 %
Neutro Abs: 4.1 10*3/uL (ref 1.7–7.7)
Platelets: 278 10*3/uL (ref 150–400)
RBC: 4.38 MIL/uL (ref 3.87–5.11)
RDW: 13.7 % (ref 11.5–15.5)
WBC: 6.3 10*3/uL (ref 4.0–10.5)

## 2016-03-16 LAB — BASIC METABOLIC PANEL
Anion gap: 9 (ref 5–15)
BUN: 12 mg/dL (ref 6–20)
CALCIUM: 8.8 mg/dL — AB (ref 8.9–10.3)
CHLORIDE: 103 mmol/L (ref 101–111)
CO2: 26 mmol/L (ref 22–32)
CREATININE: 0.41 mg/dL — AB (ref 0.44–1.00)
GFR calc non Af Amer: >60 mL/min (ref >60)
Glucose, Bld: 99 mg/dL (ref 65–99)
Potassium: 3.6 mmol/L (ref 3.5–5.1)
SODIUM: 138 mmol/L (ref 135–145)

## 2016-03-16 MED ORDER — DIPHENHYDRAMINE HCL 50 MG/ML IJ SOLN
12.5000 mg | Freq: Once | INTRAMUSCULAR | Status: AC
  Administered 2016-03-16: 12.5 mg via INTRAVENOUS
  Filled 2016-03-16: qty 1

## 2016-03-16 MED ORDER — SODIUM CHLORIDE 0.9 % IV SOLN
INTRAVENOUS | Status: DC
  Administered 2016-03-16: 13:00:00 via INTRAVENOUS

## 2016-03-16 MED ORDER — ONDANSETRON HCL 4 MG/2ML IJ SOLN: 4.0000 mg | Freq: Once | INTRAMUSCULAR | Status: DC

## 2016-03-16 MED ORDER — HYDRALAZINE HCL 20 MG/ML IJ SOLN
2.0000 mg | Freq: Once | INTRAMUSCULAR | Status: AC
  Administered 2016-03-16: 2 mg via INTRAVENOUS
  Filled 2016-03-16: qty 1

## 2016-03-16 MED ORDER — METOCLOPRAMIDE HCL 5 MG/ML IJ SOLN
2.5000 mg | Freq: Once | INTRAMUSCULAR | Status: AC
  Administered 2016-03-16: 2.5 mg via INTRAVENOUS
  Filled 2016-03-16: qty 2

## 2016-03-16 MED ORDER — CARVEDILOL 12.5 MG PO TABS: 12.5000 mg | ORAL_TABLET | ORAL | 0 refills | Status: DC

## 2016-03-16 NOTE — ED Notes (Signed)
Patient transported to CT 

## 2016-03-16 NOTE — ED Notes (Signed)
PT DISCHARGED. INSTRUCTIONS AND PRESCRIPTION GIVEN. AAOX3. PT IN NO APPARENT DISTRESS. THE OPPORTUNITY TO ASK QUESTIONS WAS PROVIDED. 

## 2016-03-16 NOTE — ED Triage Notes (Signed)
Pt arrived via EMS, took BP this am and it was elevated, EMS obtained BP 202/100, HR-88, c/o "pressure in head", denies chest pain or any other complaints, per EMS stroke screens clear, normal gait, ambulatory, hx dementia, easily confused but currently alert and oriented.

## 2016-03-16 NOTE — Discharge Instructions (Signed)
Call your primary care Dr. today to schedule an appointment for next week to be seen to have your blood pressure medication adjusted and monitored

## 2016-03-16 NOTE — ED Provider Notes (Signed)
[facility]EMERGENCY DEPT Provider Note   CSN: [id number] Arrival date & time: 03/16/16  1102  First Provider Contact:  First MD Initiated Contact with Patient 03/16/16 1134        History   Chief Complaint Chief Complaint  Patient presents with  . Hypertension    HPI Mary Harrison is a 82 y.o. female.  82 year old female presents complaining of several-day history of diffuse headache as well as increased blood pressure at home with a systolic of 202 over diastolic of her 100. Denies any chest pain or shortness of breath. No confusion or slurred speech. Patient denies any vomiting or neck pain. Patient takes 3 different medications for hypertension and has been compliant with all of them. She also has a history of dementia but family denies any recent mental status changes. She denies being ataxic. Denies any new medications be started.      Past Medical History:  Diagnosis Date  . Dementia   . Depression   . Dyslipidemia   . Headache(784.0)   . Hypercholesteremia   . Hypertension   . Memory deficits 05/11/2015  . Migraine   . Normal nuclear stress test 2005  . Palpitation     Patient Active Problem List   Diagnosis Date Noted  . Memory deficits 05/11/2015  . Acute pharyngitis 12/07/2014  . Insomnia 07/15/2013  . Dizziness 05/21/2013  . Weakness generalized 05/04/2011  . Palpitation 12/08/2010  . Hypertension   . Hypercholesteremia   . Headache   . Dyslipidemia   . Dementia     Past Surgical History:  Procedure Laterality Date  . ABDOMINAL HYSTERECTOMY    . Adhesions removed from colon    . Colon Repair      OB History    No data available       Home Medications    Prior to Admission medications   Medication Sig Start Date End Date Taking? Authorizing Provider  amLODipine (NORVASC) 10 MG tablet TAKE 1 TABLET BY MOUTH EVERY DAY 08/09/15   Cassell Clementhomas Brackbill, MD  aspirin 325 MG tablet Take 325 mg by mouth daily.     Historical Provider, MD  atorvastatin  (LIPITOR) 20 MG tablet TAKE 1/2 TABLET BY MOUTH EVERY DAY 04/18/15   Cassell Clementhomas Brackbill, MD  B Complex Vitamins (VITAMIN B COMPLEX PO) Take 1 tablet by mouth daily. daily    Historical Provider, MD  BENICAR HCT 40-12.5 MG tablet TAKE 1 TABLET BY MOUTH EVERY DAY 08/31/15   Cassell Clementhomas Brackbill, MD  carvedilol (COREG CR) 20 MG 24 hr capsule Take 20 mg by mouth. 09/14/15 09/13/16  Historical Provider, MD  Cholecalciferol (VITAMIN D) 2000 UNIT tablet Take 2,000 Units by mouth daily.      Historical Provider, MD  Diphenhydramine-APAP, sleep, (TYLENOL PM EXTRA STRENGTH PO) Take 1 tablet by mouth daily as needed (for sleep).     Historical Provider, MD  donepezil (ARICEPT) 10 MG tablet Take 1 tablet (10 mg total) by mouth at bedtime. 11/14/15   York Spanielharles K Willis, MD  gabapentin (NEURONTIN) 300 MG capsule TAKE 1 TO 2 CAPSULES BY MOUTH EVERY NIGHT AT BEDTIME 12/28/14   Cassell Clementhomas Brackbill, MD  magnesium 30 MG tablet Take 250 mg by mouth daily.     Historical Provider, MD  omeprazole (PRILOSEC) 40 MG capsule TAKE 1 CAPSULE BY MOUTH EVERY DAY 08/09/15   Cassell Clementhomas Brackbill, MD  potassium chloride SA (K-DUR,KLOR-CON) 20 MEQ tablet TAKE 1 TABLET BY MOUTH EVERY DAY 07/15/15   Cassell Clementhomas Brackbill, MD  propranolol (  INDERAL) 20 MG tablet TAKE 1 TABLET BY MOUTH TWICE DAILY 01/10/15   Cassell Clement, MD  vitamin A 16109 UNIT capsule Take 10,000 Units by mouth daily.    Historical Provider, MD  vitamin C (ASCORBIC ACID) 500 MG tablet Take 500 mg by mouth as needed.     Historical Provider, MD    Family History Family History  Problem Relation Age of Onset  . Parkinsonism Mother   . Migraines Mother   . Heart disease Father   . Alcohol abuse Brother   . Alcohol abuse Brother   . Alcohol abuse Brother   . Migraines Sister   . Seizures Sister     Social History Social History  Substance Use Topics  . Smoking status: Former Smoker    Quit date: 08/06/1968  . Smokeless tobacco: Never Used  . Alcohol use 0.6 oz/week    1 Glasses of  wine per week     Allergies   Namenda [memantine hcl]; Mevacor [lovastatin]; Olanzapine; Paxil [paroxetine hcl]; and Toprol xl [metoprolol succinate]   Review of Systems Review of Systems  All other systems reviewed and are negative.    Physical Exam Updated Vital Signs BP 196/73 (BP Location: Left Arm)   Pulse 71   Temp 98.1 F (36.7 C) (Oral)   Resp 20   Ht 5\' 4"  (1.626 m)   Wt 76.2 kg   SpO2 94%   BMI 28.84 kg/m   Physical Exam  Constitutional: She is oriented to person, place, and time. She appears well-developed and well-nourished.  Non-toxic appearance. No distress.  HENT:  Head: Normocephalic and atraumatic.  Eyes: Conjunctivae, EOM and lids are normal. Pupils are equal, round, and reactive to light.  Neck: Normal range of motion. Neck supple. No tracheal deviation present. No thyroid mass present.  Cardiovascular: Normal rate, regular rhythm and normal heart sounds.  Exam reveals no gallop.   No murmur heard. Pulmonary/Chest: Effort normal and breath sounds normal. No stridor. No respiratory distress. She has no decreased breath sounds. She has no wheezes. She has no rhonchi. She has no rales.  Abdominal: Soft. Normal appearance and bowel sounds are normal. She exhibits no distension. There is no tenderness. There is no rebound and no CVA tenderness.  Musculoskeletal: Normal range of motion. She exhibits no edema or tenderness.  Neurological: She is alert and oriented to person, place, and time. She has normal strength. No cranial nerve deficit or sensory deficit. GCS eye subscore is 4. GCS verbal subscore is 5. GCS motor subscore is 6.  Skin: Skin is warm and dry. No abrasion and no rash noted.  Psychiatric: She has a normal mood and affect. Her speech is normal and behavior is normal.  Nursing note and vitals reviewed.    ED Treatments / Results  Labs (all labs ordered are listed, but only abnormal results are displayed) Labs Reviewed  CBC WITH  DIFFERENTIAL/PLATELET  BASIC METABOLIC PANEL    EKG  EKG Interpretation None       Radiology No results found.  Procedures Procedures (including critical care time)  Medications Ordered in ED Medications  0.9 %  sodium chloride infusion (not administered)  hydrALAZINE (APRESOLINE) injection 2 mg (not administered)  metoCLOPramide (REGLAN) injection 2.5 mg (not administered)  diphenhydrAMINE (BENADRYL) injection 12.5 mg (not administered)     Initial Impression / Assessment and Plan / ED Course  I have reviewed the triage vital signs and the nursing notes.  Pertinent labs & imaging results that were  available during my care of the patient were reviewed by me and considered in my medical decision making (see chart for details).  Clinical Course    Patient's headache treated with Reglan and blood pressure treated with hydralazine. Will adjust patient's antihypertensive meds and she will follow with her primary care doctor  Final Clinical Impressions(s) / ED Diagnoses   Final diagnoses:  None    New Prescriptions New Prescriptions   No medications on file     Lorre Nick, MD 03/16/16 1429

## 2016-06-18 ENCOUNTER — Encounter (HOSPITAL_COMMUNITY): Payer: Self-pay | Admitting: *Deleted

## 2016-06-18 ENCOUNTER — Emergency Department (HOSPITAL_COMMUNITY)
Admission: EM | Admit: 2016-06-18 | Discharge: 2016-06-18 | Disposition: A | Discharge status: Home / Self Care | Payer: Medicare Other | Attending: Emergency Medicine | Admitting: Emergency Medicine

## 2016-06-18 DIAGNOSIS — Z87891 Personal history of nicotine dependence: Secondary | ICD-10-CM | POA: Insufficient documentation

## 2016-06-18 DIAGNOSIS — I10 Essential (primary) hypertension: Secondary | ICD-10-CM | POA: Insufficient documentation

## 2016-06-18 DIAGNOSIS — Z7982 Long term (current) use of aspirin: Secondary | ICD-10-CM | POA: Diagnosis not present

## 2016-06-18 NOTE — ED Notes (Signed)
Bed: [id number] Expected date:  Expected time:  Means of arrival:  Comments: EMS - HTN, dementia

## 2016-06-18 NOTE — ED Provider Notes (Signed)
[facility] Provider Note   CSN: [id number] Arrival date & time: 06/18/16  1151     History   Chief Complaint Chief Complaint  Patient presents with  . Hypertension    HPI Mary Harrison is a 82 y.o. female.  HPI Mary Harrison is a 82 y.o. female with PMH significant for dementia, depression, dyslipidemia, migraine, and HTN who presents with elevated BP.  She states she was taking her BP this morning, as she does every morning, and found it to be 190s/90s.  She denies any CP, SOB, severe HA, visual disturbance, confusion, focal numbness or weakness.  She states she forgot to take her medications yesterday and this morning.  She states she has  Mild "hunger headache" right now because she has not had anything to eat today.  She has her medications with her now. She states she is here because her family made her.   Past Medical History:  Diagnosis Date  . Dementia   . Depression   . Dyslipidemia   . Headache(784.0)   . Hypercholesteremia   . Hypertension   . Memory deficits 05/11/2015  . Migraine   . Normal nuclear stress test 2005  . Palpitation     Patient Active Problem List   Diagnosis Date Noted  . Memory deficits 05/11/2015  . Acute pharyngitis 12/07/2014  . Insomnia 07/15/2013  . Dizziness 05/21/2013  . Weakness generalized 05/04/2011  . Palpitation 12/08/2010  . Hypertension   . Hypercholesteremia   . Headache   . Dyslipidemia   . Dementia     Past Surgical History:  Procedure Laterality Date  . ABDOMINAL HYSTERECTOMY    . Adhesions removed from colon    . Colon Repair      OB History    No data available       Home Medications    Prior to Admission medications   Medication Sig Start Date End Date Taking? Authorizing Provider  amLODipine (NORVASC) 10 MG tablet TAKE 1 TABLET BY MOUTH EVERY DAY Patient taking differently: TAKE 10mg  BY MOUTH EVERy evening 08/09/15  Yes Cassell Clementhomas Brackbill, MD  Aspirin-Salicylamide-Caffeine (BC HEADACHE  POWDER PO) Take 1 packet by mouth 2 (two) times daily as needed (pain).   Yes Historical Provider, MD  atorvastatin (LIPITOR) 20 MG tablet TAKE 1/2 TABLET BY MOUTH EVERY DAY Patient taking differently: TAKE 10 MG BY MOUTH TWICE DAILY 04/18/15  Yes Cassell Clementhomas Brackbill, MD  BENICAR HCT 40-12.5 MG tablet TAKE 1 TABLET BY MOUTH EVERY DAY 08/31/15  Yes Cassell Clementhomas Brackbill, MD  carvedilol (COREG) 25 MG tablet Take 25 mg by mouth every evening.   Yes Historical Provider, MD  diphenhydramine-acetaminophen (TYLENOL PM) 25-500 MG TABS tablet Take 1 tablet by mouth at bedtime.   Yes Historical Provider, MD  donepezil (ARICEPT) 10 MG tablet Take 1 tablet (10 mg total) by mouth at bedtime. 11/14/15  Yes York Spanielharles K Willis, MD  gabapentin (NEURONTIN) 300 MG capsule TAKE 1 TO 2 CAPSULES BY MOUTH EVERY NIGHT AT BEDTIME Patient taking differently: TAKE 1 TO 2 CAPSULES BY MOUTH once daily prn for pain or restless legs 12/28/14  Yes Cassell Clementhomas Brackbill, MD  omeprazole (PRILOSEC) 40 MG capsule TAKE 1 CAPSULE BY MOUTH EVERY DAY Patient taking differently: TAKE 1 CAPSULE BY MOUTH daily 08/09/15  Yes Cassell Clementhomas Brackbill, MD  potassium chloride SA (K-DUR,KLOR-CON) 20 MEQ tablet TAKE 1 TABLET BY MOUTH EVERY DAY Patient taking differently: TAKE 10meq TABLET BY MOUTH twice 07/15/15  Yes Cassell Clementhomas Brackbill, MD  sertraline (  ZOLOFT) 50 MG tablet Take 50 mg by mouth 2 (two) times daily.    Yes Historical Provider, MD  carvedilol (COREG) 12.5 MG tablet Take 1 tablet (12.5 mg total) by mouth every morning. Patient not taking: Reported on 06/18/2016 03/16/16   Lorre NickAnthony Allen, MD  propranolol (INDERAL) 20 MG tablet TAKE 1 TABLET BY MOUTH TWICE DAILY Patient not taking: Reported on 06/18/2016 01/10/15   Cassell Clementhomas Brackbill, MD    Family History Family History  Problem Relation Age of Onset  . Parkinsonism Mother   . Migraines Mother   . Heart disease Father   . Alcohol abuse Brother   . Alcohol abuse Brother   . Alcohol abuse Brother   . Migraines  Sister   . Seizures Sister     Social History Social History  Substance Use Topics  . Smoking status: Former Smoker    Quit date: 08/06/1968  . Smokeless tobacco: Never Used  . Alcohol use 0.6 oz/week    1 Glasses of wine per week     Allergies   Namenda [memantine hcl]; Mevacor [lovastatin]; Olanzapine; Paxil [paroxetine hcl]; and Toprol xl [metoprolol succinate]   Review of Systems Review of Systems All other systems negative unless otherwise stated in HPI   Physical Exam Updated Vital Signs BP 190/79 (BP Location: Right Arm)   Pulse 80   Temp 97.8 F (36.6 C) (Oral)   Resp 18   Ht 5\' 4"  (1.626 m)   Wt 74.8 kg   SpO2 96%   BMI 28.32 kg/m   Physical Exam  Constitutional: She is oriented to person, place, and time. She appears well-developed and well-nourished.  Non-toxic appearance. She does not have a sickly appearance. She does not appear ill.  HENT:  Head: Normocephalic and atraumatic.  Mouth/Throat: Oropharynx is clear and moist.  Eyes: Conjunctivae are normal. Pupils are equal, round, and reactive to light.  Neck: Normal range of motion. Neck supple.  Cardiovascular: Normal rate and regular rhythm.   Pulmonary/Chest: Effort normal and breath sounds normal. No accessory muscle usage or stridor. No respiratory distress. She has no wheezes. She has no rhonchi. She has no rales.  Abdominal: Soft. Bowel sounds are normal. She exhibits no distension. There is no tenderness.  Musculoskeletal: Normal range of motion.  Lymphadenopathy:    She has no cervical adenopathy.  Neurological: She is alert and oriented to person, place, and time.  Mental Status:   AOx3.  Speech clear without dysarthria. Cranial Nerves:  I-not tested  II-PERRLA  III, IV, VI-EOMs intact  V-temporal and masseter strength intact  VII-symmetrical facial movements intact, no facial droop  VIII-hearing grossly intact bilaterally  IX, X-gag intact  XI-strength of sternomastoid and trapezius  muscles 5/5  XII-tongue midline Motor:   Good muscle bulk and tone  Strength 5/5 bilaterally in upper and lower extremities   Cerebellar--intact RAMs, finger to nose intact bilaterally.  Gait normal  No pronator drift Sensory:  Intact in upper and lower extremities   Skin: Skin is warm and dry.  Psychiatric: She has a normal mood and affect. Her behavior is normal.     ED Treatments / Results  Labs (all labs ordered are listed, but only abnormal results are displayed) Labs Reviewed - No data to display  EKG  EKG Interpretation None       Radiology No results found.  Procedures Procedures (including critical care time)  Medications Ordered in ED Medications - No data to display   Initial Impression / Assessment  and Plan / ED Course  I have reviewed the triage vital signs and the nursing notes.  Pertinent labs & imaging results that were available during my care of the patient were reviewed by me and considered in my medical decision making (see chart for details).  Clinical Course    Patient presents with elevated blood pressure at home. She denies any chest pain, shortness of breath, abdominal pain, confusion, severe headache. She has a normal neurological exam with no focal deficits. She had not taken her medicine yesterday or this morning. Encouraged patient to take her blood pressure medications as prescribed. There does not appear to be any evidence of end organ damage, and further workup is not indicated at this time. Return precautions discussed. Stable for discharge.   Final Clinical Impressions(s) / ED Diagnoses   Final diagnoses:  Essential hypertension    New Prescriptions New Prescriptions   No medications on file     Gwinda Maine 06/18/16 1336    Tilden Fossa, MD 06/20/16 [phone]

## 2016-06-18 NOTE — Discharge Instructions (Signed)
Continue taking your blood pressure medications as prescribed.  Follow up with your primary care physician.  Return to the ED for sudden chest pain, shortness of breath, severe headache, visual changes, confusion, or any new or concerning symptoms.

## 2016-06-18 NOTE — ED Triage Notes (Signed)
Lives alone. Hx Dementia short term memory loss. Hx HTN. Patient anxious and upset when EMS came. Patient did not take BP meds for the last two days.

## 2016-06-22 ENCOUNTER — Telehealth: Payer: Self-pay | Admitting: Neurology

## 2016-06-22 NOTE — Telephone Encounter (Signed)
I got a report from a neuropsychological evaluation that apparently was ordered by a doctor by the name of Dr. Sabra Heckhristopher Connelly. This was done through the [facility] in [place], [place] .  The study conclusions were limited by the patient's inability or unwillingness to participate. The patient apparently became irritable, defensive, and eventually refused to participate with the testing battery.  The study suggests a significant impairment in learning and memory, visual spatial construction, and problems with executive functioning and cognitive flexibility. Mild impairment in naming and visual motor processing speed was noted.  He was stopped at the patient had a clinical syndrome most consistent without sinus disease.  It was suggested that adding Namenda, and potentially other agents such as Depakote, Lamictal for behavior control may be reasonable.

## 2016-07-18 ENCOUNTER — Ambulatory Visit: Payer: Medicare Other | Admitting: Nurse Practitioner

## 2016-08-30 ENCOUNTER — Ambulatory Visit (INDEPENDENT_AMBULATORY_CARE_PROVIDER_SITE_OTHER): Payer: Medicare Other | Admitting: Neurology

## 2016-08-30 ENCOUNTER — Encounter: Payer: Self-pay | Admitting: Neurology

## 2016-08-30 VITALS — BP 165/69 | HR 63 | Ht 64.0 in | Wt 171.0 lb

## 2016-08-30 DIAGNOSIS — R413 Other amnesia: Secondary | ICD-10-CM

## 2016-08-30 DIAGNOSIS — G44221 Chronic tension-type headache, intractable: Secondary | ICD-10-CM | POA: Diagnosis not present

## 2016-08-30 MED ORDER — SERTRALINE HCL 50 MG PO TABS: 150.0000 mg | ORAL_TABLET | Freq: Every day | ORAL | 1 refills | Status: DC

## 2016-08-30 NOTE — Progress Notes (Signed)
Reason for visit: Memory disorder  Mary Harrison is an 83 y.o. female  History of present illness:  Mary Harrison is an 83 year old right-handed white female with a history of a progressive memory disorder. The patient has undergone neuropsychological testing in November 2017 that confirmed the presence of a memory disorder associated with an executive function disorder. It was felt that this was consistent with the diagnosis of Alzheimer's disease. The patient was not very cooperative for the test, however. The patient comes in today with her daughter. The daughter has met with me prior to the revisit with the patient and discussed several concerns. The patient is unstable with her demeanor, at times she will demonstrate paranoid delusions of people stealing from her. She does not wish her own family to be involved with putting medications in the pill dispenser's. She has a caretaker who comes into the house and has been asking for monetary loans. The patient may call at 3 AM in the morning indicating that her family is cheating her, and then she may act quite normally at other times. The patient still operates a motor vehicle without difficulty, but she needs assistance keeping up with medications and appointments. The daughter does the finances for her. The patient may take Tylenol PM for sleep if needed, but she indicates that she sleeps fairly well. She has noted a decrease in the taste of food over time associated with her dementia. She is able to taste sweet and sour foods. The patient returns to this office for an evaluation. The daughter is trying to obtain a letter indicating that the patient is incompetent to make any legal decisions, as she fears her mother will change the will.  Past Medical History:  Diagnosis Date  . Dementia   . Depression   . Dyslipidemia   . Headache(784.0)   . Hypercholesteremia   . Hypertension   . Memory deficits 05/11/2015  . Migraine   . Normal nuclear  stress test 2005  . Palpitation     Past Surgical History:  Procedure Laterality Date  . ABDOMINAL HYSTERECTOMY    . Adhesions removed from colon    . Colon Repair    . SKIN LESION EXCISION      Family History  Problem Relation Age of Onset  . Parkinsonism Mother   . Migraines Mother   . Heart disease Father   . Alcohol abuse Brother   . Alcohol abuse Brother   . Alcohol abuse Brother   . Migraines Sister   . Seizures Sister     Social history:  reports that she quit smoking about 48 years ago. She has never used smokeless tobacco. She reports that she drinks about 0.6 oz of alcohol per week . She reports that she does not use drugs.    Allergies  Allergen Reactions  . Namenda [Memantine Hcl] Anaphylaxis    headache  . Mevacor [Lovastatin]     Leg cramps  . Olanzapine     Ringing in the ears  . Paxil [Paroxetine Hcl]     nausea  . Toprol Xl [Metoprolol Succinate] Palpitations    Medications:  Prior to Admission medications   Medication Sig Start Date End Date Taking? Authorizing Provider  amLODipine (NORVASC) 10 MG tablet TAKE 1 TABLET BY MOUTH EVERY DAY Patient taking differently: TAKE '10mg'$  BY MOUTH EVERy evening 08/09/15  Yes Darlin Coco, MD  Aspirin-Salicylamide-Caffeine (BC HEADACHE POWDER PO) Take 1 packet by mouth 2 (two) times daily as needed (  pain).   Yes Historical Provider, MD  atorvastatin (LIPITOR) 20 MG tablet TAKE 1/2 TABLET BY MOUTH EVERY DAY Patient taking differently: TAKE 10 MG BY MOUTH TWICE DAILY 04/18/15  Yes Cassell Clementhomas Brackbill, MD  BENICAR HCT 40-12.5 MG tablet TAKE 1 TABLET BY MOUTH EVERY DAY 08/31/15  Yes Cassell Clementhomas Brackbill, MD  carvedilol (COREG) 12.5 MG tablet Take 1 tablet (12.5 mg total) by mouth every morning. 03/16/16  Yes Lorre NickAnthony Allen, MD  carvedilol (COREG) 25 MG tablet Take 25 mg by mouth every evening.   Yes Historical Provider, MD  diphenhydramine-acetaminophen (TYLENOL PM) 25-500 MG TABS tablet Take 1 tablet by mouth at bedtime.    Yes Historical Provider, MD  donepezil (ARICEPT) 10 MG tablet Take 1 tablet (10 mg total) by mouth at bedtime. 11/14/15  Yes York Spanielharles K Willis, MD  gabapentin (NEURONTIN) 300 MG capsule TAKE 1 TO 2 CAPSULES BY MOUTH EVERY NIGHT AT BEDTIME Patient taking differently: TAKE 1 TO 2 CAPSULES BY MOUTH once daily prn for pain or restless legs 12/28/14  Yes Cassell Clementhomas Brackbill, MD  omeprazole (PRILOSEC) 40 MG capsule TAKE 1 CAPSULE BY MOUTH EVERY DAY Patient taking differently: TAKE 1 CAPSULE BY MOUTH daily 08/09/15  Yes Cassell Clementhomas Brackbill, MD  potassium chloride SA (K-DUR,KLOR-CON) 20 MEQ tablet TAKE 1 TABLET BY MOUTH EVERY DAY Patient taking differently: TAKE 10meq TABLET BY MOUTH twice 07/15/15  Yes Cassell Clementhomas Brackbill, MD  propranolol (INDERAL) 20 MG tablet TAKE 1 TABLET BY MOUTH TWICE DAILY 01/10/15  Yes Cassell Clementhomas Brackbill, MD  sertraline (ZOLOFT) 50 MG tablet Take 3 tablets (150 mg total) by mouth daily. 08/30/16   York Spanielharles K Willis, MD    ROS:  Out of a complete 14 system review of symptoms, the patient complains only of the following symptoms, and all other reviewed systems are negative.  Memory loss, headache  Blood pressure (!) 165/69, pulse 63, height 5\' 4"  (1.626 m), weight 171 lb (77.6 kg).  Physical Exam  General: The patient is alert and cooperative at the time of the examination.  Skin: No significant peripheral edema is noted.   Neurologic Exam  Mental status: The patient is alert and oriented x 3 at the time of the examination. The Mini-Mental Status Examination done today shows a total score 26/30.   Cranial nerves: Facial symmetry is present. Speech is normal, no aphasia or dysarthria is noted. Extraocular movements are full. Visual fields are full.  Motor: The patient has good strength in all 4 extremities.  Sensory examination: Soft touch sensation is symmetric on the face, arms, and legs.  Coordination: The patient has good finger-nose-finger and heel-to-shin bilaterally.  Gait and  station: The patient has a normal gait. Tandem gait is unsteady. Romberg is negative. No drift is seen.  Reflexes: Deep tendon reflexes are symmetric.   Assessment/Plan:  1. Memory disorder  The patient does have a mild dementia currently. The patient is still living in the home environment, she is still operating a motor vehicle. The patient probably is not fully incompetent of making financial, legal, and medical decisions. The patient however, appears to be somewhat unstable with her emotional status. The patient does appear to have episodes of paranoid delusions which may be quite difficult to treat. We will go up on the Zoloft to help as a mood stabilizer taking 150 mg daily. The patient has had an allergy to Namenda in the past, she is to remain on Aricept. The patient will follow-up in 6 months.  Mary Palau. Keith Willis MD 08/30/2016 3:23  PM  [facility] [address] [place], [place] [address]  Phone [phone] Fax [phone]

## 2016-08-30 NOTE — Patient Instructions (Addendum)
   Will go up on the zoloft 50 mg tablet take 3 daily.

## 2016-10-23 ENCOUNTER — Telehealth: Payer: Self-pay | Admitting: Neurology

## 2016-10-23 NOTE — Telephone Encounter (Signed)
Okay for the patient to be seen through the nurse practitioner. Adding Namenda may be a good option at this time. The patient may need some form of supervision on getting her medications in properly.

## 2016-10-23 NOTE — Telephone Encounter (Signed)
Pt's daughter Steward DroneBrenda called wanting to schedule appt for pt tomorrow to see Dr Lacretia NicksW. She said the dementia has gotten worse over the past 6 months. I relayed Dr Lacretia NicksW is booked into August and will have the RN call her to go over the changes. She would prefer RN to call her older sister Urban GibsonCindy Putnam (c) [phone] as she will be able to take the phone call. Steward Drone(Brenda has limited access to phone calls)

## 2016-10-23 NOTE — Telephone Encounter (Signed)
Called and LVM for Steward DroneBrenda to call. Advised Cindy not on DPR. Unable to speak with her. Gave [facility] phone number to call.   I originally got a hold of [place], however she is not on HIPPA form and unable to speak with her.

## 2016-10-23 NOTE — Telephone Encounter (Addendum)
Picked up phone call from phone room. Spoke with Steward DroneBrenda (daughter). She is interested in adding Namenda in combination with aricept. She stated Dr Anne HahnWillis mentioned this before. I did educate that this helps slow progression of memory loss, does not improve memory. Pt is tolerating aricept okay. No SE. She is forgetful to take medications. She is worried she misses doses.  Daughter feels like mother's dementia has gotten worse. She has worse days than others. I did advise this is commonly seen with Dementia.  She states that in the evenings, she will call sister's and sisters mother-in-law in the middle of the night (2am), thinking it was day time.   She also states the caregiver takes her out to eat and afterwards, she cannot remember where they went to eat.  She has had several caregivers quit because of personality problems.   It was suggested to her that she go on a mood stabilizer after going through some testing. She is already taking zoloft 150mg .   Made appt with CM,NP tomorrow at 345pm, check in 315pm. Steward DroneBrenda will come with her to appt. Advised last MMSE 26/30. Will repeat that test. She verbalized understanding.

## 2016-10-23 NOTE — Telephone Encounter (Signed)
Dr Anne HahnWillis- I tried calling back. Had to LVM. Are they ok to see NP tomorrow? You do not have any openings or work in slots for tomorrow

## 2016-10-24 ENCOUNTER — Ambulatory Visit (INDEPENDENT_AMBULATORY_CARE_PROVIDER_SITE_OTHER): Payer: Medicare Other | Admitting: Nurse Practitioner

## 2016-10-24 ENCOUNTER — Encounter: Payer: Self-pay | Admitting: Nurse Practitioner

## 2016-10-24 ENCOUNTER — Encounter (INDEPENDENT_AMBULATORY_CARE_PROVIDER_SITE_OTHER): Payer: Self-pay

## 2016-10-24 VITALS — BP 151/72 | HR 69 | Ht 64.0 in | Wt 176.4 lb

## 2016-10-24 DIAGNOSIS — R413 Other amnesia: Secondary | ICD-10-CM | POA: Diagnosis not present

## 2016-10-24 MED ORDER — MEMANTINE HCL 5 MG PO TABS: ORAL_TABLET | ORAL | 6 refills | Status: DC

## 2016-10-24 NOTE — Progress Notes (Signed)
I have read the note, and I agree with the clinical assessment and plan.  Evalise Abruzzese KEITH   

## 2016-10-24 NOTE — Progress Notes (Signed)
[facility]  PATIENT: Mary Harrison DOB: 12-18-1930   REASON FOR VISIT: Follow-up for worsening memory  HISTORY FROM: Patient and daughter    HISTORY OF PRESENT ILLNESS:UPDATE 10/24/16 [name], 83 -year-old female returns for follow-up with a progressive memory disorder. Neuropsych testing in November 2017 confirmed the presence of memory disorder associated with executive function felt to be consistent with the diagnosis of Alzheimer's disease. Daughter had called into the office yesterday stating  mothers dementia is gotten worse she has good days and bad days. Sometimes in the evening she will call her sisters and sister-in-law's thinking it is the daytime and it is the middle of the night. Her husband died 3 years ago and she does not like to be left alone at night. She does have caregivers 4 days a week but they leave at that time patient is ready to go to bed. She is currently on Aricept 10 mg daily and is tolerating this okay without side effects. Currently the daughter prepares her medicines for the day. She continues to drive to the grocery store and to the drugstore she has not gotten lost. She has not had any accidents. No safety issues identified in the holding. She does not exercise. She returns for reevaluation    HISTORY 08/30/16 Mary Harrison is an 83 year old right-handed white female with a history of a progressive memory disorder. The patient has undergone neuropsychological testing in November 2017 that confirmed the presence of a memory disorder associated with an executive function disorder. It was felt that this was consistent with the diagnosis of Alzheimer's disease. The patient was not very cooperative for the test, however. The patient comes in today with her daughter. The daughter has met with me prior to the revisit with the patient and discussed several concerns. The patient is unstable with her demeanor, at times she will demonstrate paranoid  delusions of people stealing from her. She does not wish her own family to be involved with putting medications in the pill dispenser's. She has a caretaker who comes into the house and has been asking for monetary loans. The patient may call at 3 AM in the morning indicating that her family is cheating her, and then she may act quite normally at other times. The patient still operates a motor vehicle without difficulty, but she needs assistance keeping up with medications and appointments. The daughter does the finances for her. The patient may take Tylenol PM for sleep if needed, but she indicates that she sleeps fairly well. She has noted a decrease in the taste of food over time associated with her dementia. She is able to taste sweet and sour foods. The patient returns to this office for an evaluation. The daughter is trying to obtain a letter indicating that the patient is incompetent to make any legal decisions, as she fears her mother will change the will.   REVIEW OF SYSTEMS: Full 14 system review of systems performed and notable only for those listed, all others are neg:  Constitutional: neg  Cardiovascular: neg Ear/Nose/Throat: neg  Skin: neg Eyes: neg Respiratory: neg Gastroitestinal: neg  Hematology/Lymphatic: neg  Endocrine: neg Musculoskeletal:neg Allergy/Immunology: neg Neurological: Memory loss Psychiatric: neg Sleep : neg   ALLERGIES: Allergies  Allergen Reactions  . Namenda [Memantine Hcl] Anaphylaxis    headache  . Mevacor [Lovastatin]     Leg cramps  . Olanzapine     Ringing in the ears  . Paxil [Paroxetine Hcl]  nausea  . Toprol Xl [Metoprolol Succinate] Palpitations    HOME MEDICATIONS: Outpatient Medications Prior to Visit  Medication Sig Dispense Refill  . amLODipine (NORVASC) 10 MG tablet TAKE 1 TABLET BY MOUTH EVERY DAY (Patient taking differently: TAKE 72m BY MOUTH EVERy evening) 90 tablet 0  . Aspirin-Salicylamide-Caffeine (BC HEADACHE POWDER PO)  Take 1 packet by mouth 2 (two) times daily as needed (pain).    .Marland Kitchenatorvastatin (LIPITOR) 20 MG tablet TAKE 1/2 TABLET BY MOUTH EVERY DAY (Patient taking differently: TAKE 10 MG BY MOUTH TWICE DAILY) 15 tablet 5  . BENICAR HCT 40-12.5 MG tablet TAKE 1 TABLET BY MOUTH EVERY DAY 90 tablet 0  . carvedilol (COREG) 12.5 MG tablet Take 1 tablet (12.5 mg total) by mouth every morning. 30 tablet 0  . carvedilol (COREG) 25 MG tablet Take 25 mg by mouth every evening.    . diphenhydramine-acetaminophen (TYLENOL PM) 25-500 MG TABS tablet Take 1 tablet by mouth at bedtime.    . donepezil (ARICEPT) 10 MG tablet Take 1 tablet (10 mg total) by mouth at bedtime. 30 tablet 5  . potassium chloride SA (K-DUR,KLOR-CON) 20 MEQ tablet TAKE 1 TABLET BY MOUTH EVERY DAY (Patient taking differently: TAKE 149m TABLET BY MOUTH twice) 30 tablet 1  . sertraline (ZOLOFT) 50 MG tablet Take 3 tablets (150 mg total) by mouth daily. 270 tablet 1  . gabapentin (NEURONTIN) 300 MG capsule TAKE 1 TO 2 CAPSULES BY MOUTH EVERY NIGHT AT BEDTIME (Patient not taking: Reported on 10/24/2016) 60 capsule 3  . omeprazole (PRILOSEC) 40 MG capsule TAKE 1 CAPSULE BY MOUTH EVERY DAY (Patient not taking: Reported on 10/24/2016) 90 capsule 0  . propranolol (INDERAL) 20 MG tablet TAKE 1 TABLET BY MOUTH TWICE DAILY (Patient not taking: Reported on 10/24/2016) 60 tablet 5   No facility-administered medications prior to visit.     PAST MEDICAL HISTORY: Past Medical History:  Diagnosis Date  . Dementia   . Depression   . Dyslipidemia   . Headache(784.0)   . Hypercholesteremia   . Hypertension   . Memory deficits 05/11/2015  . Migraine   . Normal nuclear stress test 2005  . Palpitation     PAST SURGICAL HISTORY: Past Surgical History:  Procedure Laterality Date  . ABDOMINAL HYSTERECTOMY    . Adhesions removed from colon    . Colon Repair    . SKIN LESION EXCISION      FAMILY HISTORY: Family History  Problem Relation Age of Onset  .  Parkinsonism Mother   . Migraines Mother   . Heart disease Father   . Alcohol abuse Brother   . Alcohol abuse Brother   . Alcohol abuse Brother   . Migraines Sister   . Seizures Sister     SOCIAL HISTORY: Social History   Social History  . Marital status: Married    Spouse name: N/A  . Number of children: 3  . Years of education: 12   Occupational History  . retired    Social History Main Topics  . Smoking status: Former Smoker    Quit date: 08/06/1968  . Smokeless tobacco: Never Used  . Alcohol use 0.6 oz/week    1 Glasses of wine per week  . Drug use: No  . Sexual activity: Not on file   Other Topics Concern  . Not on file   Social History Narrative   Lives at home alone.   Patient drinks about 1 cup of caffeine daily.   Patient is  right handed.      PHYSICAL EXAM  Vitals:   10/24/16 1543  BP: (!) 151/72  Pulse: 69  Weight: 176 lb 6.4 oz (80 kg)  Height: _0  (1.626 m)   Body mass index is 30.28 kg/m.  Generalized: Well developed, Obese female in no acute distress  Head: normocephalic and atraumatic,. Oropharynx benign  Neck: Supple, no carotid bruits  Musculoskeletal: No deformity   Neurological examination   Mentation: Alert . AFT 8. Clock drawing 4/4. Follows all commands speech and language fluent.  MMSE - Mini Mental State Exam 10/24/2016 08/30/2016 11/14/2015  Orientation to time _1 Orientation to Place _2 Registration _3 Attention/ Calculation _4 Recall _5 Language- name 2 objects _6 Language- repeat _7 Language- follow 3 step command _8 Language- read & follow direction _9 Write a sentence _10 Copy design _11 Total score _12 Cranial nerve II-XII: Pupils were equal round reactive to light extraocular movements were full, visual field were full on confrontational test. Facial sensation and strength were normal. hearing was intact to finger rubbing bilaterally. Uvula tongue midline. head  turning and shoulder shrug were normal and symmetric.Tongue protrusion into cheek strength was normal. Motor: normal bulk and tone, full strength in the BUE, BLE, fine finger movements normal, no pronator drift. No focal weakness Sensory: normal and symmetric to light touch, pinprick, and  Vibration, In the upper and lower extremities  Coordination: finger-nose-finger, heel-to-shin bilaterally, no dysmetria Reflexes: Symmetric upper and lower plantar responses were flexor bilaterally. Gait and Station: Rising up from seated position without assistance, normal stance,  moderate stride, good arm swing, smooth turning, able to perform tiptoe, and heel walking without difficulty. Tandem gait is unsteady  DIAGNOSTIC DATA (LABS, IMAGING, TESTING) - I reviewed patient records, labs, notes, testing and imaging myself where available.  Lab Results  Component Value Date   WBC 6.3 03/16/2016   HGB 13.7 03/16/2016   HCT 39.8 03/16/2016   MCV 90.9 03/16/2016   PLT 278 03/16/2016      Component Value Date/Time   NA 138 03/16/2016 1310   K 3.6 03/16/2016 1310   CL 103 03/16/2016 1310   CO2 26 03/16/2016 1310   GLUCOSE 99 03/16/2016 1310   BUN 12 03/16/2016 1310   CREATININE 0.41 (L) 03/16/2016 1310   CREATININE 0.61 10/26/2010 0939   CALCIUM 8.8 (L) 03/16/2016 1310   PROT 7.0 06/18/2014 1027   ALBUMIN 3.4 (L) 06/18/2014 1027   AST 15 06/18/2014 1027   ALT 13 06/18/2014 1027   ALKPHOS 54 06/18/2014 1027   BILITOT 0.8 06/18/2014 1027   GFRNONAA >60 03/16/2016 1310   GFRAA >60 03/16/2016 1310    ASSESSMENT AND PLAN  83 y.o. year old female  has a past medical history of Dementia; Depression; Dyslipidemia; ; Hypertension; Memory deficits (05/11/2015); . here to follow-up for her worsening memory  PLAN: Continue Aricept at current dose Begin Namenda 5 mg every a.m. for 2 weeks then increase to 5 mg in the a.m. and 5 mg p.m.(daughter claims she had headache on higher doses in 2013) and stopped  the medication.  Continue Zoloft 150 mg daily Memories stimulating activities such as crossword puzzles word games, solitaire, Scrabble etc. Important to get out of the house exercise by walking Encouraged daughter to  ride with her mother when driving to makes sure this is safe since she is still operating a motor vehicle. I spent 25 min  in total face to face time with the patient and daughter more than 50% of which was spent counseling and coordination of care, reviewing test results reviewing medications and discussing and reviewing the diagnosis of memory loss and further treatment options. Patient is not interested in memory research, Follow-up in 6 months Dennie Bible, [facility], [facility], [facility] [address] [place], [place] [address] [phone]

## 2016-10-24 NOTE — Patient Instructions (Signed)
Continue Aricept at current dose Namenda 5 mg every a.m. for 2 weeks then increase to 5 mg in the a.m. and 5 mg p.m. Continue Zoloft 150 mg daily Memories stimulating activities such as crossword puzzles word games, solitaire, Scrabble etc. Important to get out of the house exercise by walking Follow-up in 6 months

## 2016-12-05 ENCOUNTER — Telehealth: Payer: Self-pay | Admitting: *Deleted

## 2016-12-05 NOTE — Telephone Encounter (Signed)
Received request for Dr Anne Hahn to write letter of competency for attorney. Dr Anne Hahn provided office notes for patient to pick up. Gave to medical records to process for patient/

## 2016-12-16 ENCOUNTER — Other Ambulatory Visit: Payer: Self-pay | Admitting: Neurology

## 2016-12-17 ENCOUNTER — Other Ambulatory Visit: Payer: Self-pay

## 2016-12-17 MED ORDER — DONEPEZIL HCL 10 MG PO TABS
10.0000 mg | ORAL_TABLET | Freq: Every day | ORAL | 5 refills | Status: DC
Start: 2016-12-17 — End: 2017-10-11

## 2017-03-12 ENCOUNTER — Ambulatory Visit: Payer: Medicare Other | Admitting: Adult Health

## 2017-03-13 ENCOUNTER — Encounter: Payer: Self-pay | Admitting: Adult Health

## 2017-08-11 ENCOUNTER — Other Ambulatory Visit: Payer: Self-pay | Admitting: Neurology

## 2017-10-08 ENCOUNTER — Emergency Department (HOSPITAL_COMMUNITY): Payer: Medicare Other

## 2017-10-08 ENCOUNTER — Inpatient Hospital Stay (HOSPITAL_COMMUNITY)
Admission: EM | Admit: 2017-10-08 | Discharge: 2017-10-11 | DRG: 871 | Disposition: A | Discharge status: Hospice | Payer: Medicare Other | Attending: Internal Medicine | Admitting: Internal Medicine

## 2017-10-08 DIAGNOSIS — Z515 Encounter for palliative care: Secondary | ICD-10-CM | POA: Diagnosis not present

## 2017-10-08 DIAGNOSIS — E7800 Pure hypercholesterolemia, unspecified: Secondary | ICD-10-CM | POA: Diagnosis present

## 2017-10-08 DIAGNOSIS — R112 Nausea with vomiting, unspecified: Secondary | ICD-10-CM

## 2017-10-08 DIAGNOSIS — F0391 Unspecified dementia with behavioral disturbance: Secondary | ICD-10-CM | POA: Diagnosis not present

## 2017-10-08 DIAGNOSIS — N390 Urinary tract infection, site not specified: Secondary | ICD-10-CM | POA: Diagnosis present

## 2017-10-08 DIAGNOSIS — Z820 Family history of epilepsy and other diseases of the nervous system: Secondary | ICD-10-CM

## 2017-10-08 DIAGNOSIS — F0151 Vascular dementia with behavioral disturbance: Secondary | ICD-10-CM | POA: Diagnosis not present

## 2017-10-08 DIAGNOSIS — I252 Old myocardial infarction: Secondary | ICD-10-CM

## 2017-10-08 DIAGNOSIS — I5021 Acute systolic (congestive) heart failure: Secondary | ICD-10-CM | POA: Diagnosis present

## 2017-10-08 DIAGNOSIS — Z90710 Acquired absence of both cervix and uterus: Secondary | ICD-10-CM

## 2017-10-08 DIAGNOSIS — R0600 Dyspnea, unspecified: Secondary | ICD-10-CM

## 2017-10-08 DIAGNOSIS — Z87891 Personal history of nicotine dependence: Secondary | ICD-10-CM | POA: Diagnosis not present

## 2017-10-08 DIAGNOSIS — Z66 Do not resuscitate: Secondary | ICD-10-CM | POA: Diagnosis not present

## 2017-10-08 DIAGNOSIS — I213 ST elevation (STEMI) myocardial infarction of unspecified site: Secondary | ICD-10-CM | POA: Diagnosis present

## 2017-10-08 DIAGNOSIS — F1010 Alcohol abuse, uncomplicated: Secondary | ICD-10-CM | POA: Diagnosis present

## 2017-10-08 DIAGNOSIS — K56699 Other intestinal obstruction unspecified as to partial versus complete obstruction: Secondary | ICD-10-CM | POA: Diagnosis present

## 2017-10-08 DIAGNOSIS — F329 Major depressive disorder, single episode, unspecified: Secondary | ICD-10-CM | POA: Diagnosis present

## 2017-10-08 DIAGNOSIS — E876 Hypokalemia: Secondary | ICD-10-CM | POA: Diagnosis present

## 2017-10-08 DIAGNOSIS — Z8249 Family history of ischemic heart disease and other diseases of the circulatory system: Secondary | ICD-10-CM | POA: Diagnosis not present

## 2017-10-08 DIAGNOSIS — E872 Acidosis: Secondary | ICD-10-CM | POA: Diagnosis present

## 2017-10-08 DIAGNOSIS — F0390 Unspecified dementia without behavioral disturbance: Secondary | ICD-10-CM | POA: Diagnosis present

## 2017-10-08 DIAGNOSIS — R778 Other specified abnormalities of plasma proteins: Secondary | ICD-10-CM

## 2017-10-08 DIAGNOSIS — Z888 Allergy status to other drugs, medicaments and biological substances status: Secondary | ICD-10-CM | POA: Diagnosis not present

## 2017-10-08 DIAGNOSIS — R7989 Other specified abnormal findings of blood chemistry: Secondary | ICD-10-CM

## 2017-10-08 DIAGNOSIS — Z7189 Other specified counseling: Secondary | ICD-10-CM | POA: Diagnosis not present

## 2017-10-08 DIAGNOSIS — I700 Atherosclerosis of aorta: Secondary | ICD-10-CM

## 2017-10-08 DIAGNOSIS — I214 Non-ST elevation (NSTEMI) myocardial infarction: Secondary | ICD-10-CM | POA: Diagnosis present

## 2017-10-08 DIAGNOSIS — A419 Sepsis, unspecified organism: Secondary | ICD-10-CM | POA: Diagnosis present

## 2017-10-08 DIAGNOSIS — Z811 Family history of alcohol abuse and dependence: Secondary | ICD-10-CM | POA: Diagnosis not present

## 2017-10-08 DIAGNOSIS — G43909 Migraine, unspecified, not intractable, without status migrainosus: Secondary | ICD-10-CM | POA: Diagnosis present

## 2017-10-08 DIAGNOSIS — R748 Abnormal levels of other serum enzymes: Secondary | ICD-10-CM | POA: Diagnosis not present

## 2017-10-08 DIAGNOSIS — I10 Essential (primary) hypertension: Secondary | ICD-10-CM | POA: Diagnosis not present

## 2017-10-08 DIAGNOSIS — I110 Hypertensive heart disease with heart failure: Secondary | ICD-10-CM | POA: Diagnosis present

## 2017-10-08 DIAGNOSIS — I340 Nonrheumatic mitral (valve) insufficiency: Secondary | ICD-10-CM | POA: Diagnosis not present

## 2017-10-08 DIAGNOSIS — Z789 Other specified health status: Secondary | ICD-10-CM | POA: Diagnosis present

## 2017-10-08 DIAGNOSIS — Z7289 Other problems related to lifestyle: Secondary | ICD-10-CM | POA: Diagnosis present

## 2017-10-08 DIAGNOSIS — E785 Hyperlipidemia, unspecified: Secondary | ICD-10-CM | POA: Diagnosis present

## 2017-10-08 DIAGNOSIS — K56609 Unspecified intestinal obstruction, unspecified as to partial versus complete obstruction: Secondary | ICD-10-CM | POA: Diagnosis present

## 2017-10-08 DIAGNOSIS — I420 Dilated cardiomyopathy: Secondary | ICD-10-CM | POA: Diagnosis present

## 2017-10-08 LAB — CBC WITH DIFFERENTIAL/PLATELET
BASOS ABS: 0 10*3/uL (ref 0.0–0.1)
BASOS PCT: 0 %
EOS ABS: 0 10*3/uL (ref 0.0–0.7)
Eosinophils Relative: 0 %
HCT: 43.4 % (ref 36.0–46.0)
HEMOGLOBIN: 14.3 g/dL (ref 12.0–15.0)
Lymphocytes Relative: 1 %
Lymphs Abs: 0.2 10*3/uL — ABNORMAL LOW (ref 0.7–4.0)
MCH: 31.4 pg (ref 26.0–34.0)
MCHC: 32.9 g/dL (ref 30.0–36.0)
MCV: 95.2 fL (ref 78.0–100.0)
Monocytes Absolute: 0.3 10*3/uL (ref 0.1–1.0)
Monocytes Relative: 2 %
NEUTROS PCT: 97 %
Neutro Abs: 13.4 10*3/uL — ABNORMAL HIGH (ref 1.7–7.7)
Platelets: 214 10*3/uL (ref 150–400)
RBC: 4.56 MIL/uL (ref 3.87–5.11)
RDW: 14.2 % (ref 11.5–15.5)
WBC: 14 10*3/uL — AB (ref 4.0–10.5)

## 2017-10-08 LAB — COMPREHENSIVE METABOLIC PANEL
ALK PHOS: 71 U/L (ref 38–126)
ALT: 28 U/L (ref 14–54)
AST: 52 U/L — ABNORMAL HIGH (ref 15–41)
Albumin: 3.2 g/dL — ABNORMAL LOW (ref 3.5–5.0)
Anion gap: 15 (ref 5–15)
BUN: 10 mg/dL (ref 6–20)
CALCIUM: 8.7 mg/dL — AB (ref 8.9–10.3)
CO2: 23 mmol/L (ref 22–32)
CREATININE: 0.78 mg/dL (ref 0.44–1.00)
Chloride: 105 mmol/L (ref 101–111)
Glucose, Bld: 97 mg/dL (ref 65–99)
Potassium: 2.7 mmol/L — CL (ref 3.5–5.1)
Sodium: 143 mmol/L (ref 135–145)
TOTAL PROTEIN: 6.1 g/dL — AB (ref 6.5–8.1)
Total Bilirubin: 1.1 mg/dL (ref 0.3–1.2)

## 2017-10-08 LAB — URINALYSIS, ROUTINE W REFLEX MICROSCOPIC
Bacteria, UA: NONE SEEN
Bilirubin Urine: NEGATIVE
GLUCOSE, UA: NEGATIVE mg/dL
KETONES UR: 5 mg/dL — AB
NITRITE: NEGATIVE
PH: 6 (ref 5.0–8.0)
Protein, ur: NEGATIVE mg/dL
Specific Gravity, Urine: >1.046 — ABNORMAL HIGH (ref 1.005–1.030)

## 2017-10-08 LAB — LIPASE, BLOOD: LIPASE: 26 U/L (ref 11–51)

## 2017-10-08 LAB — I-STAT CG4 LACTIC ACID, ED
Lactic Acid, Venous: 2.94 mmol/L (ref 0.5–1.9)
Lactic Acid, Venous: 2.21 mmol/L (ref 0.5–1.9)

## 2017-10-08 LAB — I-STAT TROPONIN, ED
TROPONIN I, POC: 7.28 ng/mL — AB (ref 0.00–0.08)
Troponin i, poc: 4.22 ng/mL (ref 0.00–0.08)

## 2017-10-08 LAB — MAGNESIUM: Magnesium: 1.7 mg/dL (ref 1.7–2.4)

## 2017-10-08 LAB — ETHANOL

## 2017-10-08 MED ORDER — FENTANYL CITRATE (PF) 100 MCG/2ML IJ SOLN
50.0000 ug | Freq: Once | INTRAMUSCULAR | Status: AC
  Administered 2017-10-08: 50 ug via INTRAVENOUS
  Filled 2017-10-08: qty 2

## 2017-10-08 MED ORDER — SODIUM CHLORIDE 0.9 % IV SOLN: INTRAVENOUS | Status: DC

## 2017-10-08 MED ORDER — HYDRALAZINE HCL 20 MG/ML IJ SOLN: 5.0000 mg | INTRAMUSCULAR | Status: DC | PRN

## 2017-10-08 MED ORDER — MORPHINE SULFATE (PF) 4 MG/ML IV SOLN
1.0000 mg | INTRAVENOUS | Status: DC | PRN
Start: 2017-10-08 — End: 2017-10-10

## 2017-10-08 MED ORDER — LORAZEPAM 1 MG PO TABS: 1.0000 mg | ORAL_TABLET | Freq: Four times a day (QID) | ORAL | Status: DC | PRN

## 2017-10-08 MED ORDER — LORAZEPAM 2 MG/ML IJ SOLN
1.0000 mg | Freq: Four times a day (QID) | INTRAMUSCULAR | Status: DC | PRN
  Administered 2017-10-09: 1 mg via INTRAVENOUS
  Filled 2017-10-08: qty 1

## 2017-10-08 MED ORDER — LORAZEPAM 2 MG/ML IJ SOLN
0.0000 mg | Freq: Four times a day (QID) | INTRAMUSCULAR | Status: DC
  Administered 2017-10-09: 1 mg via INTRAVENOUS
  Administered 2017-10-10: 2 mg via INTRAVENOUS
  Filled 2017-10-08 (×2): qty 1

## 2017-10-08 MED ORDER — LORAZEPAM 2 MG/ML IJ SOLN
0.5000 mg | Freq: Once | INTRAMUSCULAR | Status: AC
  Administered 2017-10-08: 0.5 mg via INTRAVENOUS
  Filled 2017-10-08: qty 1

## 2017-10-08 MED ORDER — THIAMINE HCL 100 MG/ML IJ SOLN
100.0000 mg | Freq: Every day | INTRAMUSCULAR | Status: DC
  Administered 2017-10-09 – 2017-10-10 (×2): 100 mg via INTRAVENOUS
  Filled 2017-10-08 (×3): qty 2

## 2017-10-08 MED ORDER — SODIUM CHLORIDE 0.9 % IV BOLUS (SEPSIS)
500.0000 mL | Freq: Once | INTRAVENOUS | Status: AC
  Administered 2017-10-08: 500 mL via INTRAVENOUS

## 2017-10-08 MED ORDER — HEPARIN BOLUS VIA INFUSION
4000.0000 [IU] | Freq: Once | INTRAVENOUS | Status: AC
  Administered 2017-10-08: 4000 [IU] via INTRAVENOUS
  Filled 2017-10-08: qty 4000

## 2017-10-08 MED ORDER — LABETALOL HCL 5 MG/ML IV SOLN
5.0000 mg | INTRAVENOUS | Status: DC | PRN
Start: 2017-10-08 — End: 2017-10-10

## 2017-10-08 MED ORDER — NITROGLYCERIN 0.4 MG SL SUBL: 0.4000 mg | SUBLINGUAL_TABLET | SUBLINGUAL | Status: DC | PRN

## 2017-10-08 MED ORDER — FOLIC ACID 1 MG PO TABS
1.0000 mg | ORAL_TABLET | Freq: Every day | ORAL | Status: DC
  Filled 2017-10-08: qty 1

## 2017-10-08 MED ORDER — IOPAMIDOL (ISOVUE-300) INJECTION 61%
INTRAVENOUS | Status: AC
  Administered 2017-10-08: 100 mL
  Filled 2017-10-08: qty 100

## 2017-10-08 MED ORDER — POTASSIUM CHLORIDE CRYS ER 20 MEQ PO TBCR
40.0000 meq | EXTENDED_RELEASE_TABLET | Freq: Once | ORAL | Status: DC
  Filled 2017-10-08: qty 2

## 2017-10-08 MED ORDER — VITAMIN B-1 100 MG PO TABS
100.0000 mg | ORAL_TABLET | Freq: Every day | ORAL | Status: DC
  Filled 2017-10-08: qty 1

## 2017-10-08 MED ORDER — MAGNESIUM SULFATE 2 GM/50ML IV SOLN
2.0000 g | Freq: Once | INTRAVENOUS | Status: DC
  Filled 2017-10-08: qty 50

## 2017-10-08 MED ORDER — ZOLPIDEM TARTRATE 5 MG PO TABS: 5.0000 mg | ORAL_TABLET | Freq: Every evening | ORAL | Status: DC | PRN

## 2017-10-08 MED ORDER — ACETAMINOPHEN 325 MG PO TABS
650.0000 mg | ORAL_TABLET | ORAL | Status: DC | PRN
  Administered 2017-10-09 – 2017-10-10 (×3): 650 mg via ORAL
  Filled 2017-10-08 (×4): qty 2

## 2017-10-08 MED ORDER — CEFTRIAXONE SODIUM 1 G IJ SOLR
1.0000 g | INTRAMUSCULAR | Status: DC
  Administered 2017-10-09 – 2017-10-10 (×3): 1 g via INTRAVENOUS
  Filled 2017-10-08 (×4): qty 10

## 2017-10-08 MED ORDER — ADULT MULTIVITAMIN W/MINERALS CH
1.0000 | ORAL_TABLET | Freq: Every day | ORAL | Status: DC
  Filled 2017-10-08: qty 1

## 2017-10-08 MED ORDER — ASPIRIN 300 MG RE SUPP
300.0000 mg | Freq: Every day | RECTAL | Status: DC
  Administered 2017-10-10: 300 mg via RECTAL
  Filled 2017-10-08 (×2): qty 1

## 2017-10-08 MED ORDER — HEPARIN (PORCINE) IN NACL 100-0.45 UNIT/ML-% IJ SOLN
1100.0000 [IU]/h | INTRAMUSCULAR | Status: DC
  Administered 2017-10-08 – 2017-10-09 (×2): 900 [IU]/h via INTRAVENOUS
  Filled 2017-10-08 (×2): qty 250

## 2017-10-08 MED ORDER — SODIUM CHLORIDE 0.9 % IV BOLUS (SEPSIS): 500.0000 mL | Freq: Once | INTRAVENOUS | Status: DC

## 2017-10-08 MED ORDER — ONDANSETRON HCL 4 MG/2ML IJ SOLN: 4.0000 mg | Freq: Four times a day (QID) | INTRAMUSCULAR | Status: DC | PRN

## 2017-10-08 MED ORDER — LORAZEPAM 2 MG/ML IJ SOLN: 0.0000 mg | Freq: Two times a day (BID) | INTRAMUSCULAR | Status: DC

## 2017-10-08 MED ORDER — POTASSIUM CHLORIDE 10 MEQ/100ML IV SOLN
10.0000 meq | INTRAVENOUS | Status: AC
  Administered 2017-10-08: 10 meq via INTRAVENOUS
  Filled 2017-10-08: qty 100

## 2017-10-08 NOTE — ED Provider Notes (Signed)
[facility]  Refer to provider note for full history and physical examination.  Briefly, patient is an 84 year old female with history of dementia, hypertension, depression who presents for evaluation of nausea, vomiting, and left breast pain.  Initial troponin greater than 4.  Patient has been evaluated by STEMI coordinator and recommends obtaining second troponin for trending  Before deciding to potentially take patient to Cath Lab.  We are also awaiting abdominal pain workup including lab work and CT scan  Physical Exam  BP 135/62   Pulse (!) 105   Temp 98 F (36.7 C) (Oral)   Resp (!) 26   SpO2 96%   Physical Exam  Constitutional: She appears well-developed and well-nourished. No distress.  HENT:  Head: Normocephalic and atraumatic.  Eyes: Conjunctivae are normal. Right eye exhibits no discharge. Left eye exhibits no discharge.  Neck: No JVD present. No tracheal deviation present.  Cardiovascular:  Tachycardic  Pulmonary/Chest: Effort normal.  Abdominal: Soft. Bowel sounds are normal. She exhibits distension. There is no tenderness.  Well-healed midline surgical incision.  Small mass palpable inferior to the umbilicus.  Not reducible.  Abdomen nontender.  Musculoskeletal: She exhibits no edema.  Neurological: She is alert.  Skin: Skin is warm and dry. No erythema.  Psychiatric: She has a normal mood and affect. Her behavior is normal.  Nursing note and vitals reviewed.   MDM   6:14 PM Dr. Anne FuSkains recommends possible palliative care consult versus hospitalist admission but will assess patient and speak with family.  6:38 PM Dr. Anne FuSkains has seen and evaluated patient.  He recommends inpatient admission and will start heparin drip. Of note, patient disclosed to Dr. Anne FuSkains that she drinks approximately 1.5 L of wine daily.  Last drink was at around 1:30 AM yesterday.  Patient will likely require CIWA protocol and monitoring. 6:44 PM Patient has a small  bowel obstruction on CT as well as hypokalemia. Spoke with Dr. Fredricka Bonineonnor with gen surg who recommends NG tube and states patient will likely not require surgical intervention. 7:33 PM Spoke with Dr. Clyde LundborgNiu, who agrees to assume care of patient and bring her into the hospital for further evaluation and management. CRITICAL CARE Performed by: Jeanie SewerMina A Edmon Magid   Total critical care time: 40 minutes  Critical care time was exclusive of separately billable procedures and treating other patients.  Critical care was necessary to treat or prevent imminent or life-threatening deterioration.  Critical care was time spent personally by me on the following activities: development of treatment plan with patient and/or surrogate as well as nursing, discussions with consultants, evaluation of patient's response to treatment, examination of patient, obtaining history from patient or surrogate, ordering and performing treatments and interventions, ordering and review of laboratory studies, ordering and review of radiographic studies, pulse oximetry and re-evaluation of patient's condition.   Jeanie SewerFawze, Neri Samek A, PA-C 10/09/17 0104  Tegeler, Canary Brimhristopher J, MD 10/09/17 [id number]

## 2017-10-08 NOTE — ED Notes (Addendum)
attempted IV x2. EDP notified of delay.

## 2017-10-08 NOTE — ED Provider Notes (Signed)
[facility]   CSN: [id number] Arrival date & time: 10/08/17  1317     History   Chief Complaint Chief Complaint  Patient presents with  . Nausea  . Emesis    HPI Mary Harrison is a 84 y.o. female past medical history of hypertension, dementia, depression who presents for evaluation of nausea and vomiting that began today.  Daughter is with patient and states that patient had been having episodes of vomiting since this morning.  When daughter found patient, patient was very diaphoretic and had been had been persistent vomiting.  No blood noted in the vomit.  Patient has not been able to eat or drink anything today secondary to symptoms.  Patient also notes some generalized weakness, no focal weakness. Patient states that she is not having any abdominal pain.  Patient states that she has not been having any diarrhea.  She does not recall when her last bowel movement was.  Patient does have a history of normal surgeries and daughter reports that she has a history of adhesions.  No history of small bowel obstruction.  Patient does not recall she is passing flatus.  Patient denies any fever, chest pain, difficulty breathing, dysuria, hematuria.  The history is provided by the patient. The history is limited by the condition of the patient.    Past Medical History:  Diagnosis Date  . Dementia   . Depression   . Dyslipidemia   . Headache(784.0)   . Hypercholesteremia   . Hypertension   . Memory deficits 05/11/2015  . Migraine   . Normal nuclear stress test 2005  . Palpitation     Patient Active Problem List   Diagnosis Date Noted  . NSTEMI (non-ST elevated myocardial infarction) (HCC) 10/08/2017  . SBO (small bowel obstruction) (HCC) 10/08/2017  . Sepsis (HCC) 10/08/2017  . Hypokalemia 10/08/2017  . Alcohol use 10/08/2017  . Memory deficits 05/11/2015  . Acute pharyngitis 12/07/2014  . Insomnia 07/15/2013  . Dizziness  05/21/2013  . Weakness generalized 05/04/2011  . Palpitation 12/08/2010  . Hypertension   . Hypercholesteremia   . Headache   . Dyslipidemia   . Dementia     Past Surgical History:  Procedure Laterality Date  . ABDOMINAL HYSTERECTOMY    . Adhesions removed from colon    . Colon Repair    . SKIN LESION EXCISION      OB History    No data available       Home Medications    Prior to Admission medications   Medication Sig Start Date End Date Taking? Authorizing Provider  amLODipine (NORVASC) 10 MG tablet TAKE 1 TABLET BY MOUTH EVERY DAY Patient taking differently: TAKE 10mg  BY MOUTH EVERy evening 08/09/15  Yes Cassell ClementBrackbill, Thomas, MD  Aspirin-Salicylamide-Caffeine (BC HEADACHE POWDER PO) Take 1 packet by mouth 2 (two) times daily as needed (pain).   Yes [provider]  atorvastatin (LIPITOR) 20 MG tablet TAKE 1/2 TABLET BY MOUTH EVERY DAY Patient taking differently: TAKE 10 MG BY MOUTH TWICE DAILY 04/18/15  Yes Cassell ClementBrackbill, Thomas, MD  BENICAR HCT 40-12.5 MG tablet TAKE 1 TABLET BY MOUTH EVERY DAY 08/31/15  Yes Cassell ClementBrackbill, Thomas, MD  carvedilol (COREG) 25 MG tablet Take 25 mg by mouth every evening.   Yes [provider]  diclofenac sodium (VOLTAREN) 1 % GEL Apply 4 g topically 4 (four) times daily as needed for pain. 12/11/16  Yes [provider]  dimenhyDRINATE (DRAMAMINE) 50 MG tablet  Take 50 mg by mouth as needed for nausea.   Yes [provider]  diphenhydramine-acetaminophen (TYLENOL PM) 25-500 MG TABS tablet Take 1 tablet by mouth at bedtime.   Yes [provider]  donepezil (ARICEPT) 10 MG tablet Take 1 tablet (10 mg total) by mouth at bedtime. 12/17/16  Yes York Spaniel, MD  memantine [facility]) 5 MG tablet 5mg  in the am for 2 weeks then increase to 5mg  in the am and 5 mg in the pm 10/24/16  Yes Nilda Riggs, NP  potassium chloride SA (K-DUR,KLOR-CON) 20 MEQ tablet TAKE 1 TABLET BY MOUTH EVERY DAY Patient taking  differently: TAKE TABLET BY MOUTH twice 07/15/15  Yes Cassell Clement, MD  sertraline (ZOLOFT) 50 MG tablet TAKE 3 TABLETS(150 MG) BY MOUTH DAILY 08/12/17  Yes York Spaniel, MD  carvedilol (COREG) 12.5 MG tablet Take 1 tablet (12.5 mg total) by mouth every morning. Patient not taking: Reported on 10/08/2017 03/16/16   Lorre Nick, MD    Family History Family History  Problem Relation Age of Onset  . Parkinsonism Mother   . Migraines Mother   . Heart disease Father   . Alcohol abuse Brother   . Alcohol abuse Brother   . Alcohol abuse Brother   . Migraines Sister   . Seizures Sister     Social History Social History   Tobacco Use  . Smoking status: Former Smoker    Last attempt to quit: 08/06/1968    Years since quitting: 49.2  . Smokeless tobacco: Never Used  Substance Use Topics  . Alcohol use: Yes    Alcohol/week: 0.6 oz    Types: 1 Glasses of wine per week  . Drug use: No     Allergies   Mevacor [lovastatin]; Olanzapine; Paxil [paroxetine hcl]; Namenda [memantine hcl]; and Toprol xl [metoprolol succinate]   Review of Systems Review of Systems  Constitutional: Negative for fever.  Respiratory: Negative for cough and shortness of breath.   Cardiovascular: Negative for chest pain.  Gastrointestinal: Positive for nausea and vomiting. Negative for abdominal pain.  Genitourinary: Negative for dysuria and hematuria.  Neurological: Positive for weakness (generalized). Negative for headaches.  All other systems reviewed and are negative.    Physical Exam Updated Vital Signs BP 124/67   Pulse (!) 118   Temp 98 F (36.7 C) (Oral)   Resp (!) 21   SpO2 (!) 86%   Physical Exam  Constitutional: She is oriented to person, place, and time. She appears well-developed and well-nourished.  Elderly appearing  HENT:  Head: Normocephalic and atraumatic.  Mouth/Throat: Oropharynx is clear and moist. Mucous membranes are dry.  Eyes: Conjunctivae, EOM and lids are  normal. Pupils are equal, round, and reactive to light.  Neck: Full passive range of motion without pain.  Cardiovascular: Regular rhythm, normal heart sounds and normal pulses. Tachycardia present. Exam reveals no gallop and no friction rub.  No murmur heard. Pulses:      Radial pulses are 2+ on the right side, and 2+ on the left side.       Dorsalis pedis pulses are 2+ on the right side, and 2+ on the left side.  Pulmonary/Chest: Effort normal and breath sounds normal.  No evidence of respiratory distress. Able to speak in full sentences without difficulty.  Abdominal: Soft. Normal appearance. She exhibits distension. Bowel sounds are decreased. There is generalized tenderness. There is no rigidity and no guarding.  Abdomen is slightly distended.  Diffuse tenderness noted throughout.  No rigidity, guarding, peritoneal signs.  Musculoskeletal: Normal range of motion.  Neurological: She is alert and oriented to person, place, and time.  Follows commands, Moves all extremities  5/5 strength to BUE and BLE  Sensation intact throughout all major nerve distributions  Skin: Skin is warm and dry. Capillary refill takes less than 2 seconds.  Psychiatric: She has a normal mood and affect. Her speech is normal.  Nursing note and vitals reviewed.    ED Treatments / Results  Labs (all labs ordered are listed, but only abnormal results are displayed) Labs Reviewed  COMPREHENSIVE METABOLIC PANEL - Abnormal; Notable for the following components:      Result Value   Potassium 2.7 (*)    Calcium 8.7 (*)    Total Protein 6.1 (*)    Albumin 3.2 (*)    AST 52 (*)    All other components within normal limits  CBC WITH DIFFERENTIAL/PLATELET - Abnormal; Notable for the following components:   WBC 14.0 (*)    Neutro Abs 13.4 (*)    Lymphs Abs 0.2 (*)    All other components within normal limits  I-STAT TROPONIN, ED - Abnormal; Notable for the following components:   Troponin i, poc 4.22 (*)    All  other components within normal limits  I-STAT CG4 LACTIC ACID, ED - Abnormal; Notable for the following components:   Lactic Acid, Venous 2.94 (*)    All other components within normal limits  I-STAT CG4 LACTIC ACID, ED - Abnormal; Notable for the following components:   Lactic Acid, Venous 2.21 (*)    All other components within normal limits  I-STAT TROPONIN, ED - Abnormal; Notable for the following components:   Troponin i, poc 7.28 (*)    All other components within normal limits  CULTURE, BLOOD (ROUTINE X 2)  CULTURE, BLOOD (ROUTINE X 2)  URINE CULTURE  ETHANOL  LIPASE, BLOOD  MAGNESIUM  URINALYSIS, ROUTINE W REFLEX MICROSCOPIC  HEPARIN LEVEL (UNFRACTIONATED)  CBC  LACTIC ACID, PLASMA  LACTIC ACID, PLASMA  PROCALCITONIN  PROTIME-INR  APTT  HEMOGLOBIN A1C  LIPID PANEL  TROPONIN I  TROPONIN I  TROPONIN I  BRAIN NATRIURETIC PEPTIDE  TYPE AND SCREEN    EKG  EKG Interpretation  Date/Time:  Tuesday October 08 2017 13:19:43 EST Ventricular Rate:  126 PR Interval:    QRS Duration: 94 QT Interval:  325 QTC Calculation: 471 R Axis:   -18 Text Interpretation:  Sinus tachycardia Borderline left axis deviation Abnormal inferior Q waves Consider anterior infarct No previous ECGs available Confirmed by Alvira Monday ([id number]) on 10/08/2017 1:34:32 PM Also confirmed by Alvira Monday ([id number]), editor Barbette Hair [phone])  on 10/08/2017 3:31:49 PM       Radiology Ct Abdomen Pelvis W Contrast  Result Date: 10/08/2017 CLINICAL DATA:  Nausea and vomiting.  Generalized weakness. EXAM: CT ABDOMEN AND PELVIS WITH CONTRAST TECHNIQUE: Multidetector CT imaging of the abdomen and pelvis was performed using the standard protocol following bolus administration of intravenous contrast. CONTRAST:  ISOVUE-300 IOPAMIDOL (ISOVUE-300) INJECTION 61% COMPARISON:  Abdominal radiographs from earlier today. FINDINGS: Lower chest: Scattered parenchymal bands at both lung bases compatible with mild  scarring or atelectasis. Hepatobiliary: Diffuse hepatic steatosis. No definite liver surface irregularity. No liver mass. Small venovenous shunt in the inferior segment 3 left liver lobe (series 8/image 10). Normal gallbladder with no radiopaque cholelithiasis. No biliary ductal dilatation. Pancreas: Normal, with no mass or duct dilation. Spleen: Normal size. No mass. Adrenals/Urinary Tract: Normal  adrenals. Hypodense subcentimeter interpolar left renal cortical focus is too small to characterize and requires no follow-up. Otherwise normal kidneys, with no hydronephrosis. Normal bladder. Stomach/Bowel: Normal non-distended stomach. There is a small infraumbilical ventral hernia to the right of midline containing a tiny portion of a mildly thick walled small bowel loop (series 7/image 91 and series 6/images 33-37), proximal to which there a few mildly dilated small bowel loops with air-fluid levels measuring up to 4.0 cm diameter, and proximal to which the small bowel is relatively decompressed. No additional sites of small bowel wall thickening. Appendix not discretely visualized. No pericecal inflammatory changes. Postsurgical changes from partial distal colectomy with intact appearing distal colonic anastomosis. Mild diverticulosis in the remnant sigmoid colon. No large bowel wall thickening or significant pericolonic fat stranding. Vascular/Lymphatic: Atherosclerotic nonaneurysmal abdominal aorta. Patent portal, splenic, hepatic and renal veins. No pathologically enlarged lymph nodes in the abdomen or pelvis. Reproductive: Status post hysterectomy, with no abnormal findings at the vaginal cuff. No adnexal mass. Other: No pneumoperitoneum, ascites or focal fluid collection. Small fat containing periumbilical hernia. Moderate fat containing midline supraumbilical ventral abdominal hernia (series 3/image 35). Musculoskeletal: No aggressive appearing focal osseous lesions. Marked thoracolumbar spondylosis.  IMPRESSION: 1. Mechanical small-bowel obstruction in the mid small bowel at the level of a small infraumbilical ventral abdominal hernia to the right of midline, which contains a tiny portion of a mildly thick walled small bowel loop. Early ischemic changes cannot be excluded in this partially herniated small bowel loop. Surgical consultation advised. 2. No pneumatosis, free air or abscess. 3. Postsurgical changes from partial distal colectomy. Mild diverticulosis in the remnant sigmoid colon. 4. Chronic findings include: Aortic Atherosclerosis (ICD10-I70.0). Diffuse hepatic steatosis. Additional small fat containing periumbilical hernia and moderate fat containing midline supraumbilical ventral abdominal hernia. Electronically Signed   By: Delbert Phenix M.D.   On: 10/08/2017 18:32   Dg Chest Portable 1 View  Result Date: 10/08/2017 CLINICAL DATA:  Nausea and vomiting today. EXAM: PORTABLE CHEST 1 VIEW COMPARISON:  PA and lateral chest 04/13/2010.  CT chest 04/14/2010. FINDINGS: Heart size is upper normal. There is aortic atherosclerosis. Lungs are clear. No pneumothorax or pleural effusion. No acute bony abnormality. IMPRESSION: No acute disease. Electronically Signed   By: Drusilla Kanner M.D.   On: 10/08/2017 15:28   Dg Abd Portable 2 Views  Result Date: 10/08/2017 CLINICAL DATA:  Nausea and vomiting today. EXAM: PORTABLE ABDOMEN - 2 VIEW COMPARISON:  None. FINDINGS: The bowel gas pattern is normal. There is no evidence of free air. No radio-opaque calculi or other significant radiographic abnormality is seen. IMPRESSION: Negative exam. Electronically Signed   By: Drusilla Kanner M.D.   On: 10/08/2017 15:27    Procedures Procedures (including critical care time)  Medications Ordered in ED Medications  potassium chloride 10 mEq in 100 mL IVPB (0 mEq Intravenous Stopped 10/08/17 2022)  magnesium sulfate IVPB 2 g 50 mL (2 g Intravenous Restarted 10/08/17 1925)  heparin ADULT infusion 100 units/mL (25000  units/296mL sodium chloride 0.45%) (900 Units/hr Intravenous New Bag/Given 10/08/17 1920)  aspirin suppository 300 mg (not administered)  nitroGLYCERIN (NITROSTAT) SL tablet 0.4 mg (not administered)  morphine 4 MG/ML injection 1 mg (not administered)  0.9 %  sodium chloride infusion (not administered)  sodium chloride 0.9 % bolus 500 mL (not administered)  LORazepam (ATIVAN) tablet 1 mg (not administered)    Or  LORazepam (ATIVAN) injection 1 mg (not administered)  thiamine (VITAMIN B-1) tablet 100 mg (not administered)  Or  thiamine (B-1) injection 100 mg (not administered)  folic acid (FOLVITE) tablet 1 mg (not administered)  multivitamin with minerals tablet 1 tablet (not administered)  LORazepam (ATIVAN) injection 0-4 mg (not administered)    Followed by  LORazepam (ATIVAN) injection 0-4 mg (not administered)  acetaminophen (TYLENOL) tablet 650 mg (not administered)  ondansetron (ZOFRAN) injection 4 mg (not administered)  zolpidem (AMBIEN) tablet 5 mg (not administered)  hydrALAZINE (APRESOLINE) injection 5 mg (not administered)  labetalol (NORMODYNE,TRANDATE) injection 5 mg (not administered)  sodium chloride 0.9 % bolus 500 mL (0 mLs Intravenous Stopped 10/08/17 1559)  fentaNYL (SUBLIMAZE) injection 50 mcg (50 mcg Intravenous Given 10/08/17 1739)  LORazepam (ATIVAN) injection 0.5 mg (0.5 mg Intravenous Given 10/08/17 1751)  iopamidol (ISOVUE-300) 61 % injection (100 mLs  Contrast Given 10/08/17 1801)  heparin bolus via infusion 4,000 Units (4,000 Units Intravenous Bolus from Bag 10/08/17 1920)     Initial Impression / Assessment and Plan / ED Course  I have reviewed the triage vital signs and the nursing notes.  Pertinent labs & imaging results that were available during my care of the patient were reviewed by me and considered in my medical decision making (see chart for details).     84 year old female with past medical history of depression, dementia, hypertension presents for  evaluation of nausea/vomiting that began today.  Daughter reports that when she saw patient, she was very diaphoretic.  Has been having nonbloody nonbilious emesis throughout the day.  No chest pain, difficulty breathing.  Patient is afebrile, tachycardic.  Blood pressure is within normal limits.  Patient appears very uncomfortable but no acute distress.  Abdomen is slightly distended with diffuse tenderness noted.  No focal point.  Decreased bowel sounds noted.  Concern for ACS etiology versus infectious etiology versus bowel obstruction.  We will plan to check basic labs, EKG, CT abdomen.  Daughter who had been with patient at home came to the ED.  She reports that this morning, patient complained of some "left breast pain" that went into the middle of her chest.  Patient reports that the pain had resolved.  On ED arrival, patient was not complaining of any chest pain. Troponin and EKG have already been ordered.   I discussed with STEMI coordinator, Marchelle Folks who had received a copy of her EKG from EMS prior to ED arrival.  Concern of some PR depressions in lead I, lead II and some ST elevation in V3 V4.  Given findings, concern that this may be an acute cardiac event.  Troponin is pending.  Troponin positive at 4.22.  I discussed with STEMI coordinator who discussed with cardiology.  Will come to the ED to evaluate patient.  Repeat EKG shows improvement.  I discussed with STEMI coordinator after ED evaluation.  Plan is to repeat troponin.  If troponin increases, we will plan to contact STEMI coordinator for discussion of further cardiac intervention. Cardiology has discussed with family regarding severity of cardiac intervention. Meanwhile, will continue workup patient for any other possibilities of symptoms. If trop is the same or is improving, cardiology can be consulted during probable admission.   Portable abdomen XR shows no evidence of perforation, obstruction. Given that patient has tenderness, will  plan for CT abd/pelvis for evaluation.   Lactic acid is 2.94.  CBC shows slight leukocytosis.  Ethanol level is unremarkable.  Given that patient is not febrile and is not hypotensive and lactic acid is below 4.  No evidence for full fluid of sepsis  protocol. Labs pending.   Patient signed out to [facility], PA-C with repeat troponin, labs, and CT abd/pelvis pending. If  Troponin is increasingly elevated, plan to contact STEMI coordinator. Anticipate admission. Please see note by oncoming provider for further ED course.   Final Clinical Impressions(s) / ED Diagnoses   Final diagnoses:  Intestinal obstruction, unspecified cause, unspecified whether partial or complete (HCC)  Nausea and vomiting, intractability of vomiting not specified, unspecified vomiting type  Elevated troponin    ED Discharge Orders    None       Rosana Hoes 10/08/17 2209    Alvira Monday, MD 10/09/17 [phone]

## 2017-10-08 NOTE — ED Notes (Addendum)
RN Placed NG tube in left nare but pt unable to tolerate and stated she could not breath well. RN checked placement of tube and was unable to hear air movement in abdomen. Attempted air check x3. Pt and family then requested removal, RN complied.

## 2017-10-08 NOTE — Consult Note (Signed)
Surgical Consultation Requesting provider: Dr. Rush Landmark  CC: [facility]BO  HPI: history obtained from chart review and from the patient's daughters as she is demented and has just received Ativan and is therefore nonconversant. Prior to that she had be becoming more agitated and trying to leave.  84 year old woman with dementia who is being evaluated in the emergency department where she presented earlier today with generalized weakness, nausea, dry heaves and possible diarrhea that began this morning. Also some reports of left breast pain. Per her daughters poor at the bedside, she did exhibit a decreased appetite starting on Sunday. She is also been complaining of substernal pain. They deny any bilious emesis, hematemesis, r change in bowel function until today. Diarrhea was not witnessed, but her daughter states that she heard the patient in the bathroom and it smelled like stool. Presently,the patient denies any pain or discomfort. Workup in the emergency room revealed that she is having a myocardial infarction with increasingly elevated troponins and possible ST elevation as well as mildly elevated lactic acid. Cardiology has consulted and diagnosed her with a non-ST elevation myocardial infarction. They plan to check an echo and medically manage with IV heparin. No plans for cardiac catheterization at this time; paste on their note, poor prognosis is anticipated and the plan to consult palliative care in the morning. A CT scan was obtained of her abdomen and pelvis which showed a possible small bowel obstruction with question of early ischemic changes in a ventral hernia, for which surgery is consulted. The patient has had multiple abdominal surgeries including one with a missed colon injury. He states that she has had 3 or 4 bowel obstructions in the past.  She drinks a liter and a half of wine every day. She lives alone with cameras and her home and daughters nearby help her.  Allergies  Allergen  Reactions  . Mevacor [Lovastatin]     Leg cramps  . Olanzapine     Ringing in the ears  . Paxil [Paroxetine Hcl]     nausea  . Namenda [Memantine Hcl] Other (See Comments)    headache  . Toprol Xl [Metoprolol Succinate] Palpitations    Past Medical History:  Diagnosis Date  . Dementia   . Depression   . Dyslipidemia   . Headache(784.0)   . Hypercholesteremia   . Hypertension   . Memory deficits 05/11/2015  . Migraine   . Normal nuclear stress test 2005  . Palpitation     Past Surgical History:  Procedure Laterality Date  . ABDOMINAL HYSTERECTOMY    . Adhesions removed from colon    . Colon Repair    . SKIN LESION EXCISION      Family History  Problem Relation Age of Onset  . Parkinsonism Mother   . Migraines Mother   . Heart disease Father   . Alcohol abuse Brother   . Alcohol abuse Brother   . Alcohol abuse Brother   . Migraines Sister   . Seizures Sister     Social History   Socioeconomic History  . Marital status: Married    Spouse name: Not on file  . Number of children: 3  . Years of education: 22  . Highest education level: Not on file  Social Needs  . Financial resource strain: Not on file  . Food insecurity - worry: Not on file  . Food insecurity - inability: Not on file  . Transportation needs - medical: Not on file  . Transportation needs - non-medical:  Not on file  Occupational History  . Occupation: retired  Tobacco Use  . Smoking status: Former Smoker    Last attempt to quit: 08/06/1968    Years since quitting: 49.2  . Smokeless tobacco: Never Used  Substance and Sexual Activity  . Alcohol use: Yes    Alcohol/week: 0.6 oz    Types: 1 Glasses of wine per week  . Drug use: No  . Sexual activity: Not on file  Other Topics Concern  . Not on file  Social History Narrative   Lives at home alone.   Patient drinks about 1 cup of caffeine daily.   Patient is right handed.     No current facility-administered medications on file prior  to encounter.    Current Outpatient Medications on File Prior to Encounter  Medication Sig Dispense Refill  . amLODipine (NORVASC) 10 MG tablet TAKE 1 TABLET BY MOUTH EVERY DAY (Patient taking differently: TAKE 10mg  BY MOUTH EVERy evening) 90 tablet 0  . Aspirin-Salicylamide-Caffeine (BC HEADACHE POWDER PO) Take 1 packet by mouth 2 (two) times daily as needed (pain).    [name] atorvastatin (LIPITOR) 20 MG tablet TAKE 1/2 TABLET BY MOUTH EVERY DAY (Patient taking differently: TAKE 10 MG BY MOUTH TWICE DAILY) 15 tablet 5  . BENICAR HCT 40-12.5 MG tablet TAKE 1 TABLET BY MOUTH EVERY DAY 90 tablet 0  . carvedilol (COREG) 25 MG tablet Take 25 mg by mouth every evening.    . diclofenac sodium (VOLTAREN) 1 % GEL Apply 4 g topically 4 (four) times daily as needed for pain.    [name] dimenhyDRINATE (DRAMAMINE) 50 MG tablet Take 50 mg by mouth as needed for nausea.    . diphenhydramine-acetaminophen (TYLENOL PM) 25-500 MG TABS tablet Take 1 tablet by mouth at bedtime.    . donepezil (ARICEPT) 10 MG tablet Take 1 tablet (10 mg total) by mouth at bedtime. 30 tablet 5  . memantine (NAMENDA) 5 MG tablet 5mg  in the am for 2 weeks then increase to 5mg  in the am and 5 mg in the pm 60 tablet 6  . potassium chloride SA (K-DUR,KLOR-CON) 20 MEQ tablet TAKE 1 TABLET BY MOUTH EVERY DAY (Patient taking differently: TAKE TABLET BY MOUTH twice) 30 tablet 1  . sertraline (ZOLOFT) 50 MG tablet TAKE 3 TABLETS(150 MG) BY MOUTH DAILY 90 tablet 0  . carvedilol (COREG) 12.5 MG tablet Take 1 tablet (12.5 mg total) by mouth every morning. (Patient not taking: Reported on 10/08/2017) 30 tablet 0    Review of Systems: a complete, 10pt review of systems was unable to be completed due to patient mental status  Physical Exam: Vitals:   10/08/17 1700 10/08/17 1745  BP: 129/63 124/67  Pulse: (!) 110 (!) 118  Resp: 18 (!) 21  Temp:    SpO2: 95% (!) 86%   Gen: somnolent elderly female in no distress Head: normocephalic,  atraumatic Eyes: extraocular motions intact, anicteric.  Neck: supple without mass or thyromegaly Chest: unlabored respirations, symmetrical air entry, clear bilaterally Cardiovascular: RRR with palpable distal pulses, no pedal edema Abdomen: soft, obese, nontender. Mildly distended. There is reducible umbilical hernia containing fat. The right-sided hernia mentioned on the CT scan does not correspond any palpable fascial defect or palpable herniated structure. No mass or organomegaly. She has a completely benign abdominal exam.  Extremities: warm, without edema, no deformities  Neuro: sedated Psych: unable to assess due to mental status Skin: warm and dry GU: awake has been placed and her urine  is extremely dark and concentrated appearing   CBC Latest Ref Rng & Units 10/08/2017 03/16/2016 12/07/2013  WBC 4.0 - 10.5 K/uL 14.0(H) 6.3 6.7  Hemoglobin 12.0 - 15.0 g/dL 16.1 09.6 04.5  Hematocrit 36.0 - 46.0 % 43.4 39.8 41.3  Platelets 150 - 400 K/uL 214 278 318    CMP Latest Ref Rng & Units 10/08/2017 03/16/2016 06/18/2014  Glucose 65 - 99 mg/dL 97 99 409(W)  BUN 6 - 20 mg/dL 10 12 9   Creatinine 0.44 - 1.00 mg/dL 1.19 1.47(W) 0.5  Sodium 135 - 145 mmol/L 143 138 138  Potassium 3.5 - 5.1 mmol/L 2.7(LL) 3.6 3.6  Chloride 101 - 111 mmol/L 105 103 104  CO2 22 - 32 mmol/L 23 26 23   Calcium 8.9 - 10.3 mg/dL 2.9(F) 6.2(Z) 8.8  Total Protein 6.5 - 8.1 g/dL 6.1(L) - 7.0  Total Bilirubin 0.3 - 1.2 mg/dL 1.1 - 0.8  Alkaline Phos 38 - 126 U/L 71 - 54  AST 15 - 41 U/L 52(H) - 15  ALT 14 - 54 U/L 28 - 13    No results found for: INR, PROTIME  Imaging: Ct Abdomen Pelvis W Contrast  Result Date: 10/08/2017 CLINICAL DATA:  Nausea and vomiting.  Generalized weakness. EXAM: CT ABDOMEN AND PELVIS WITH CONTRAST TECHNIQUE: Multidetector CT imaging of the abdomen and pelvis was performed using the standard protocol following bolus administration of intravenous contrast. CONTRAST:  ISOVUE-300 IOPAMIDOL  (ISOVUE-300) INJECTION 61% COMPARISON:  Abdominal radiographs from earlier today. FINDINGS: Lower chest: Scattered parenchymal bands at both lung bases compatible with mild scarring or atelectasis. Hepatobiliary: Diffuse hepatic steatosis. No definite liver surface irregularity. No liver mass. Small venovenous shunt in the inferior segment 3 left liver lobe (series 8/image 10). Normal gallbladder with no radiopaque cholelithiasis. No biliary ductal dilatation. Pancreas: Normal, with no mass or duct dilation. Spleen: Normal size. No mass. Adrenals/Urinary Tract: Normal adrenals. Hypodense subcentimeter interpolar left renal cortical focus is too small to characterize and requires no follow-up. Otherwise normal kidneys, with no hydronephrosis. Normal bladder. Stomach/Bowel: Normal non-distended stomach. There is a small infraumbilical ventral hernia to the right of midline containing a tiny portion of a mildly thick walled small bowel loop (series 7/image 91 and series 6/images 33-37), proximal to which there a few mildly dilated small bowel loops with air-fluid levels measuring up to 4.0 cm diameter, and proximal to which the small bowel is relatively decompressed. No additional sites of small bowel wall thickening. Appendix not discretely visualized. No pericecal inflammatory changes. Postsurgical changes from partial distal colectomy with intact appearing distal colonic anastomosis. Mild diverticulosis in the remnant sigmoid colon. No large bowel wall thickening or significant pericolonic fat stranding. Vascular/Lymphatic: Atherosclerotic nonaneurysmal abdominal aorta. Patent portal, splenic, hepatic and renal veins. No pathologically enlarged lymph nodes in the abdomen or pelvis. Reproductive: Status post hysterectomy, with no abnormal findings at the vaginal cuff. No adnexal mass. Other: No pneumoperitoneum, ascites or focal fluid collection. Small fat containing periumbilical hernia. Moderate fat containing  midline supraumbilical ventral abdominal hernia (series 3/image 35). Musculoskeletal: No aggressive appearing focal osseous lesions. Marked thoracolumbar spondylosis. IMPRESSION: 1. Mechanical small-bowel obstruction in the mid small bowel at the level of a small infraumbilical ventral abdominal hernia to the right of midline, which contains a tiny portion of a mildly thick walled small bowel loop. Early ischemic changes cannot be excluded in this partially herniated small bowel loop. Surgical consultation advised. 2. No pneumatosis, free air or abscess. 3. Postsurgical changes from partial distal  colectomy. Mild diverticulosis in the remnant sigmoid colon. 4. Chronic findings include: Aortic Atherosclerosis (ICD10-I70.0). Diffuse hepatic steatosis. Additional small fat containing periumbilical hernia and moderate fat containing midline supraumbilical ventral abdominal hernia. Electronically Signed   By: Delbert PhenixJason A Poff M.D.   On: 10/08/2017 18:32   Dg Chest Portable 1 View  Result Date: 10/08/2017 CLINICAL DATA:  Nausea and vomiting today. EXAM: PORTABLE CHEST 1 VIEW COMPARISON:  PA and lateral chest 04/13/2010.  CT chest 04/14/2010. FINDINGS: Heart size is upper normal. There is aortic atherosclerosis. Lungs are clear. No pneumothorax or pleural effusion. No acute bony abnormality. IMPRESSION: No acute disease. Electronically Signed   By: Drusilla Kannerhomas  Dalessio M.D.   On: 10/08/2017 15:28   Dg Abd Portable 2 Views  Result Date: 10/08/2017 CLINICAL DATA:  Nausea and vomiting today. EXAM: PORTABLE ABDOMEN - 2 VIEW COMPARISON:  None. FINDINGS: The bowel gas pattern is normal. There is no evidence of free air. No radio-opaque calculi or other significant radiographic abnormality is seen. IMPRESSION: Negative exam. Electronically Signed   By: Drusilla Kannerhomas  Dalessio M.D.   On: 10/08/2017 15:4827    A/P: 84 year old woman with multiple active issues at this time and concern for small bowel obstruction on CT scan. Dry heaves  starting today. I suspect that her mildly elevated lactic acid and leukocytosis reflect dehydration as it sounds like she has not been eating or drinking much for the last 2-3 days. Her abdominal exam is benign and she is currently denying pain although she recently received Ativan.  -recommend NG tube insertion and GI tract decompression, gentle fluid resuscitation and correct electrolytes -Serial abdominal exams -We will start small bowel obstruction protocol tomorrow -management of myocardial infarction per cardiology recommendations and primary team -agree with Dr. Anne FuSkains plan for palliative care consultation. If she does progress to needing an operation in the setting of present NSTEMI and age/comorbidities her prognosis is very poor.    Phylliss Blakeshelsea Nickolette Espinola, MD [facility], [place] Pager [phone]

## 2017-10-08 NOTE — ED Triage Notes (Signed)
Pt arrived via [facility] c/o generalized weakness and n/v/d that began this morning. EMS unable to obtain temperature PTA. Pt is alert and oriented x4. Pt denies CP. EMS gave 4mg  zofran PTA.

## 2017-10-08 NOTE — ED Notes (Signed)
Pt becoming more agitated and eager to leave. Family and RN attempted to convince pt to remain in bed. Pt refusing. EDP notified.

## 2017-10-08 NOTE — ED Notes (Signed)
Lactic acid and I-stat troponin result given to Dr. Rush Landmarkegeler

## 2017-10-08 NOTE — ED Notes (Signed)
Report attempted 

## 2017-10-08 NOTE — ED Notes (Signed)
Pt placed on PureWick per Mike (RN) 

## 2017-10-08 NOTE — Consult Note (Signed)
Cardiology Consultation:   Patient ID: Mary LeuBetty G Molzahn; [id number]; 17-Aug-1930   Admit date: 10/08/2017 Date of Consult: 10/08/2017  Primary Care Provider: Eliott NineLeague-Sobon, Jennifer, MD Primary Cardiologist: No primary care provider on file. Former EcologistBrackbill, new Architectrimary Electrophysiologist:  non   Patient Profile:   Mary Harrison is a 84 y.o. female with a hx of dementia who is being seen today for the evaluation of myocardial infarction at the request of Dr. Rush Landmarkegeler.  History of Present Illness:   Mary Harrison is an 84 year old lady former patient of Dr. Yevonne PaxBrackbill's with dementia on Namenda, who lives alone with cameras in her house and daughters who live nearby to help assist her, who drinks approximately 1-1/2 L of wine a day especially since the death of her husband here with nausea and vomiting, fatigue, epigastric discomfort who was found to have a troponin value of 4 which increased to  7  in the setting of EKG abnormalities which show mild anterior ST segment elevation as well as mild inferior ST segment elevations as well.   Our STEMI team originally evaluated her but this was called off earlier in the ER stay given her underlying clinical presentation.  She currently does not have any chest discomfort.  She was quite agitated earlier but now has calmed down with benzodiazepines.  2 of her daughters were in room and another daughter was on cell phone for discussion.  CT scan of the abdomen now shows small bowel obstruction.  Her lactic acid was 3.  Potassium was low 2.7.  White count 14,000.  Past Medical History:  Diagnosis Date  . Dementia   . Depression   . Dyslipidemia   . Headache(784.0)   . Hypercholesteremia   . Hypertension   . Memory deficits 05/11/2015  . Migraine   . Normal nuclear stress test 2005  . Palpitation     Past Surgical History:  Procedure Laterality Date  . ABDOMINAL HYSTERECTOMY    . Adhesions removed from colon    . Colon Repair    . SKIN LESION  EXCISION       Home Medications:  Prior to Admission medications   Medication Sig Start Date End Date Taking? Authorizing Provider  amLODipine (NORVASC) 10 MG tablet TAKE 1 TABLET BY MOUTH EVERY DAY Patient taking differently: TAKE 10mg  BY MOUTH EVERy evening 08/09/15  Yes Cassell ClementBrackbill, Thomas, MD  Aspirin-Salicylamide-Caffeine (BC HEADACHE POWDER PO) Take 1 packet by mouth 2 (two) times daily as needed (pain).   Yes [provider]  atorvastatin (LIPITOR) 20 MG tablet TAKE 1/2 TABLET BY MOUTH EVERY DAY Patient taking differently: TAKE 10 MG BY MOUTH TWICE DAILY 04/18/15  Yes Cassell ClementBrackbill, Thomas, MD  BENICAR HCT 40-12.5 MG tablet TAKE 1 TABLET BY MOUTH EVERY DAY 08/31/15  Yes Cassell ClementBrackbill, Thomas, MD  carvedilol (COREG) 25 MG tablet Take 25 mg by mouth every evening.   Yes [provider]  diclofenac sodium (VOLTAREN) 1 % GEL Apply 4 g topically 4 (four) times daily as needed for pain. 12/11/16  Yes [provider]  dimenhyDRINATE (DRAMAMINE) 50 MG tablet Take 50 mg by mouth as needed for nausea.   Yes [provider]  diphenhydramine-acetaminophen (TYLENOL PM) 25-500 MG TABS tablet Take 1 tablet by mouth at bedtime.   Yes [provider]  donepezil (ARICEPT) 10 MG tablet Take 1 tablet (10 mg total) by mouth at bedtime. 12/17/16  Yes York SpanielWillis, Charles K, MD  memantine [facility](NAMENDA) 5 MG tablet 5mg  in the am for 2  weeks then increase to 5mg  in the am and 5 mg in the pm 10/24/16  Yes Nilda Riggs, NP  potassium chloride SA (K-DUR,KLOR-CON) 20 MEQ tablet TAKE 1 TABLET BY MOUTH EVERY DAY Patient taking differently: TAKE TABLET BY MOUTH twice 07/15/15  Yes Cassell Clement, MD  sertraline (ZOLOFT) 50 MG tablet TAKE 3 TABLETS(150 MG) BY MOUTH DAILY 08/12/17  Yes York Spaniel, MD  carvedilol (COREG) 12.5 MG tablet Take 1 tablet (12.5 mg total) by mouth every morning. Patient not taking: Reported on 10/08/2017 03/16/16   Lorre Nick, MD    Inpatient  Medications: Scheduled Meds: . potassium chloride  40 mEq Oral Once   Continuous Infusions: . magnesium sulfate 1 - 4 g bolus IVPB    . potassium chloride     PRN Meds:   Allergies:    Allergies  Allergen Reactions  . Mevacor [Lovastatin]     Leg cramps  . Olanzapine     Ringing in the ears  . Paxil [Paroxetine Hcl]     nausea  . Namenda [Memantine Hcl] Other (See Comments)    headache  . Toprol Xl [Metoprolol Succinate] Palpitations    Social History:   Social History   Socioeconomic History  . Marital status: Married    Spouse name: Not on file  . Number of children: 3  . Years of education: 32  . Highest education level: Not on file  Social Needs  . Financial resource strain: Not on file  . Food insecurity - worry: Not on file  . Food insecurity - inability: Not on file  . Transportation needs - medical: Not on file  . Transportation needs - non-medical: Not on file  Occupational History  . Occupation: retired  Tobacco Use  . Smoking status: Former Smoker    Last attempt to quit: 08/06/1968    Years since quitting: 49.2  . Smokeless tobacco: Never Used  Substance and Sexual Activity  . Alcohol use: Yes    Alcohol/week: 0.6 oz    Types: 1 Glasses of wine per week  . Drug use: No  . Sexual activity: Not on file  Other Topics Concern  . Not on file  Social History Narrative   Lives at home alone.   Patient drinks about 1 cup of caffeine daily.   Patient is right handed.     Family History:    Family History  Problem Relation Age of Onset  . Parkinsonism Mother   . Migraines Mother   . Heart disease Father   . Alcohol abuse Brother   . Alcohol abuse Brother   . Alcohol abuse Brother   . Migraines Sister   . Seizures Sister      ROS:  Please see the history of present illness.   All other ROS reviewed and negative.     Physical Exam/Data:   Vitals:   10/08/17 1430 10/08/17 1445 10/08/17 1700 10/08/17 1745  BP: (!) 120/59 135/62 129/63  124/67  Pulse: (!) 108 (!) 105 (!) 110 (!) 118  Resp: (!) 25 (!) 26 18 (!) 21  Temp:      TempSrc:      SpO2: 95% 96% 95% (!) 86%    Intake/Output Summary (Last 24 hours) at 10/08/2017 1841 Last data filed at 10/08/2017 1559 Gross per 24 hour  Intake 500 ml  Output -  Net 500 ml   There were no vitals filed for this visit. There is no height or weight on file  to calculate BMI.  General:  Well nourished, well developed, in no acute distress, somewhat sleepy HEENT: normal Lymph: no adenopathy Neck: no JVD Endocrine:  No thryomegaly Vascular: No carotid bruits;   Cardiac:  normal S1, S2; RRR; no murmur  Lungs:  clear to auscultation bilaterally, no wheezing, rhonchi or rales  Abd: Protuberant belly, mild tenderness, hypoactive bowel sounds Ext: no edema Musculoskeletal:  No deformities, BUE and BLE strength normal and equal Skin: warm and dry  Neuro: Able to answer some questions, baseline dementia, no focal abnormalities noted Psych: Dementia noted  EKG:  The EKG was personally reviewed and demonstrates: As described in HPI personally viewed  Telemetry:  Telemetry was personally reviewed and demonstrates: Sinus rhythm  Relevant CV Studies: Echocardiogram pending  Laboratory Data:  Chemistry Recent Labs  Lab 10/08/17 1439  NA 143  K 2.7*  CL 105  CO2 23  GLUCOSE 97  BUN 10  CREATININE 0.78  CALCIUM 8.7*  GFRNONAA >60  GFRAA >60  ANIONGAP 15    Recent Labs  Lab 10/08/17 1439  PROT 6.1*  ALBUMIN 3.2*  AST 52*  ALT 28  ALKPHOS 71  BILITOT 1.1   Hematology Recent Labs  Lab 10/08/17 1439  WBC 14.0*  RBC 4.56  HGB 14.3  HCT 43.4  MCV 95.2  MCH 31.4  MCHC 32.9  RDW 14.2  PLT 214   Cardiac EnzymesNo results for input(s): TROPONINI in the last 168 hours.  Recent Labs  Lab 10/08/17 1443 10/08/17 1744  TROPIPOC 4.22* 7.28*    BNPNo results for input(s): BNP, PROBNP in the last 168 hours.  DDimer No results for input(s): DDIMER in the last 168  hours.  Radiology/Studies:  Ct Abdomen Pelvis W Contrast  Result Date: 10/08/2017 CLINICAL DATA:  Nausea and vomiting.  Generalized weakness. EXAM: CT ABDOMEN AND PELVIS WITH CONTRAST TECHNIQUE: Multidetector CT imaging of the abdomen and pelvis was performed using the standard protocol following bolus administration of intravenous contrast. CONTRAST:  ISOVUE-300 IOPAMIDOL (ISOVUE-300) INJECTION 61% COMPARISON:  Abdominal radiographs from earlier today. FINDINGS: Lower chest: Scattered parenchymal bands at both lung bases compatible with mild scarring or atelectasis. Hepatobiliary: Diffuse hepatic steatosis. No definite liver surface irregularity. No liver mass. Small venovenous shunt in the inferior segment 3 left liver lobe (series 8/image 10). Normal gallbladder with no radiopaque cholelithiasis. No biliary ductal dilatation. Pancreas: Normal, with no mass or duct dilation. Spleen: Normal size. No mass. Adrenals/Urinary Tract: Normal adrenals. Hypodense subcentimeter interpolar left renal cortical focus is too small to characterize and requires no follow-up. Otherwise normal kidneys, with no hydronephrosis. Normal bladder. Stomach/Bowel: Normal non-distended stomach. There is a small infraumbilical ventral hernia to the right of midline containing a tiny portion of a mildly thick walled small bowel loop (series 7/image 91 and series 6/images 33-37), proximal to which there a few mildly dilated small bowel loops with air-fluid levels measuring up to 4.0 cm diameter, and proximal to which the small bowel is relatively decompressed. No additional sites of small bowel wall thickening. Appendix not discretely visualized. No pericecal inflammatory changes. Postsurgical changes from partial distal colectomy with intact appearing distal colonic anastomosis. Mild diverticulosis in the remnant sigmoid colon. No large bowel wall thickening or significant pericolonic fat stranding. Vascular/Lymphatic:  Atherosclerotic nonaneurysmal abdominal aorta. Patent portal, splenic, hepatic and renal veins. No pathologically enlarged lymph nodes in the abdomen or pelvis. Reproductive: Status post hysterectomy, with no abnormal findings at the vaginal cuff. No adnexal mass. Other: No pneumoperitoneum, ascites or  focal fluid collection. Small fat containing periumbilical hernia. Moderate fat containing midline supraumbilical ventral abdominal hernia (series 3/image 35). Musculoskeletal: No aggressive appearing focal osseous lesions. Marked thoracolumbar spondylosis. IMPRESSION: 1. Mechanical small-bowel obstruction in the mid small bowel at the level of a small infraumbilical ventral abdominal hernia to the right of midline, which contains a tiny portion of a mildly thick walled small bowel loop. Early ischemic changes cannot be excluded in this partially herniated small bowel loop. Surgical consultation advised. 2. No pneumatosis, free air or abscess. 3. Postsurgical changes from partial distal colectomy. Mild diverticulosis in the remnant sigmoid colon. 4. Chronic findings include: Aortic Atherosclerosis (ICD10-I70.0). Diffuse hepatic steatosis. Additional small fat containing periumbilical hernia and moderate fat containing midline supraumbilical ventral abdominal hernia. Electronically Signed   By: Delbert Phenix M.D.   On: 10/08/2017 18:32   Dg Chest Portable 1 View  Result Date: 10/08/2017 CLINICAL DATA:  Nausea and vomiting today. EXAM: PORTABLE CHEST 1 VIEW COMPARISON:  PA and lateral chest 04/13/2010.  CT chest 04/14/2010. FINDINGS: Heart size is upper normal. There is aortic atherosclerosis. Lungs are clear. No pneumothorax or pleural effusion. No acute bony abnormality. IMPRESSION: No acute disease. Electronically Signed   By: Drusilla Kanner M.D.   On: 10/08/2017 15:28   Dg Abd Portable 2 Views  Result Date: 10/08/2017 CLINICAL DATA:  Nausea and vomiting today. EXAM: PORTABLE ABDOMEN - 2 VIEW COMPARISON:   None. FINDINGS: The bowel gas pattern is normal. There is no evidence of free air. No radio-opaque calculi or other significant radiographic abnormality is seen. IMPRESSION: Negative exam. Electronically Signed   By: Drusilla Kanner M.D.   On: 10/08/2017 15:27    Assessment and Plan:    Non-ST elevation myocardial infarction - Troponin now 7 in the setting of underlying small bowel obstruction, no active chest pain, few days of worsening lethargy, nausea. -We will check an echocardiogram to assess overall function - I will start her on IV heparin to continue over the next 24-48 hours, watch for any signs of bleeding - Her lactic acidosis elevation may be indicative of hypoperfusion of her end organs.  Perhaps this is from either reduced cardiac output or underlying abdominal process. -Treat medically.  No invasive approach.  No cardiac catheterization.  Tachycardia likely from underlying metabolic derangement, question reduced EF however.  Hypokalemia -Replete  Dementia - On Namenda.  Previously quite agitated.  Settled with benzodiazepines.  Alcohol use -1.5 L of wine daily.  Will need CIWA protocol  Small bowel obstruction -Likely will need NG tube.  Per primary team. -Unable to give traditional p.o. medications.  Cannot give statin, traditional p.o. aspirin  Had lengthy conversation with her daughters expressing the gravity of her current situation, and increased risk of morbidity mortality over the next several days to weeks.  She lives alone with aids, cameras and daughters living close by.  She drinks quite heavily.  I think she would benefit greatly from our palliative care team.  We will place a consult in for palliative care to see tomorrow.  Critical care time 45 minutes spent with patient, extensive data review, extensive family discussion, discussion of end-of-life, discussion with ER care team in this lady with critical illness, multisystem organ involvement.   For  questions or updates, please contact [facility] Please consult [email or web address] for contact info under Cardiology/STEMI.   Signed, Donato Schultz, MD  10/08/2017 6:41 PM

## 2017-10-08 NOTE — Progress Notes (Signed)
ANTICOAGULATION CONSULT NOTE - Initial Consult  Pharmacy Consult for Heparin Indication: chest pain/ACS  Allergies  Allergen Reactions  . Mevacor [Lovastatin]     Leg cramps  . Olanzapine     Ringing in the ears  . Paxil [Paroxetine Hcl]     nausea  . Namenda [Memantine Hcl] Other (See Comments)    headache  . Toprol Xl [Metoprolol Succinate] Palpitations    Patient Measurements: Ideal Body Weight: 55 kg  Heparin Dosing Weight: 74 kg   Vital Signs: Temp: 98 F (36.7 C) (03/05 1424) Temp Source: Oral (03/05 1424) BP: 124/67 (03/05 1745) Pulse Rate: 118 (03/05 1745)  Labs: Recent Labs    10/08/17 1439  HGB 14.3  HCT 43.4  PLT 214  CREATININE 0.78    CrCl cannot be calculated (Unknown ideal weight.).   Medical History: Past Medical History:  Diagnosis Date  . Dementia   . Depression   . Dyslipidemia   . Headache(784.0)   . Hypercholesteremia   . Hypertension   . Memory deficits 05/11/2015  . Migraine   . Normal nuclear stress test 2005  . Palpitation     Medications:   (Not in a hospital admission)  Assessment: [age] YOF here with vomiting, diaphoresis and generalized weakness. Patient's troponin continues to trend up. Pharmacy consulted to start IV heparin. She is not on any anticoagulation at home.  H/H and Plt wnl.   Goal of Therapy:  Heparin level 0.3-0.7 units/ml Monitor platelets by anticoagulation protocol: Yes   Plan:  -Heparin 4000 units IV once, then start IV heparin at 1000 units/hr  -F/u 8 hr HL -Monitor daily CBC, HL and s/s of bleeding  Vinnie LevelBenjamin Aboubacar Matsuo, PharmD., BCPS Clinical Pharmacist Clinical phone for 10/08/17 until 11pm: [id number]: [phone] If after 11pm, please call main pharmacy at: [phone]

## 2017-10-08 NOTE — H&P (Addendum)
History and Physical    Mary LeuBetty G Heyer YNW:[id number] DOB: [birth year] DOA: 10/08/2017  Referring MD/NP/PA:   PCP: Eliott NineLeague-Sobon, Jennifer, MD   Patient coming from:  The patient is coming from home.  At baseline, pt is independent for most of ADL.  Chief Complaint: Nausea, vomiting, abdominal pain, chest pain  HPI: Mary Harrison is a 84 y.o. female with medical history significant of dementia, hypertension, hyperlipidemia, migraine headaches, depression, alcohol use, who presents with nausea, vomiting, abdominal pain and chest pain.  Patient began to have nausea, vomiting, abdominal pain since this morning. Mostly dry heaves. Her abdominal pain is diffuse, moderate, sharp, nonradiating. Last bowel movement was at about 10 AM, with tiny amount of stool per her daughter.  Daughter states the patient had one episode of chest pain this morning, which was located in the substernal area, dull, moderate, nonradiating. Currently patient denies chest pain, shortness of breath. No cough, fever or chills. Denies symptoms of UTI or unilateral weakness.  ED Course: pt was found to have troponin 4.22-->7.28, WBC 14.0, lactic acid 2.94--> 2.21, potassium 2.7, creatinine 0.78, negative chest x-ray, temperature normal, tachycardia, tachypnea, oxygen saturation 86% which improved to 94% on 2 L nasal cannula oxygen. CT-abdomen/pelvis showed a small bowel obstruction. Patient is admitted to stepdown as inpatient. Gen. surgeon, Dr. Doylene Canardonner and cardiology, Dr. Jacklyn ShellSkins were consulted.  Review of Systems:   General: no fevers, chills, no body weight gain, has poor appetite, has fatigue HEENT: no blurry vision, hearing changes or sore throat Respiratory: no dyspnea, coughing, wheezing CV: has chest pain, no palpitations GI: has nausea, vomiting, abdominal pain, no diarrhea, constipation GU: no dysuria, burning on urination, increased urinary frequency, hematuria  Ext: no leg edema Neuro: no unilateral weakness,  numbness, or tingling, no vision change or hearing loss Skin: no rash, no skin tear. MSK: No muscle spasm, no deformity, no limitation of range of movement in spin Heme: No easy bruising.  Travel history: No recent long distant travel.  Allergy:  Allergies  Allergen Reactions  . Mevacor [Lovastatin]     Leg cramps  . Olanzapine     Ringing in the ears  . Paxil [Paroxetine Hcl]     nausea  . Namenda [Memantine Hcl] Other (See Comments)    headache  . Toprol Xl [Metoprolol Succinate] Palpitations    Past Medical History:  Diagnosis Date  . Dementia   . Depression   . Dyslipidemia   . Headache(784.0)   . Hypercholesteremia   . Hypertension   . Memory deficits 05/11/2015  . Migraine   . Normal nuclear stress test 2005  . Palpitation     Past Surgical History:  Procedure Laterality Date  . ABDOMINAL HYSTERECTOMY    . Adhesions removed from colon    . Colon Repair    . SKIN LESION EXCISION      Social History:  reports that she quit smoking about 49 years ago. she has never used smokeless tobacco. She reports that she drinks about 0.6 oz of alcohol per week. She reports that she does not use drugs.  Family History:  Family History  Problem Relation Age of Onset  . Parkinsonism Mother   . Migraines Mother   . Heart disease Father   . Alcohol abuse Brother   . Alcohol abuse Brother   . Alcohol abuse Brother   . Migraines Sister   . Seizures Sister      Prior to Admission medications   Medication Sig Start Date End  Date Taking? Authorizing Provider  amLODipine (NORVASC) 10 MG tablet TAKE 1 TABLET BY MOUTH EVERY DAY Patient taking differently: TAKE 10mg  BY MOUTH EVERy evening 08/09/15  Yes Cassell Clement, MD  Aspirin-Salicylamide-Caffeine (BC HEADACHE POWDER PO) Take 1 packet by mouth 2 (two) times daily as needed (pain).   Yes [provider]  atorvastatin (LIPITOR) 20 MG tablet TAKE 1/2 TABLET BY MOUTH EVERY DAY Patient taking differently: TAKE 10 MG BY  MOUTH TWICE DAILY 04/18/15  Yes Cassell Clement, MD  BENICAR HCT 40-12.5 MG tablet TAKE 1 TABLET BY MOUTH EVERY DAY 08/31/15  Yes Cassell Clement, MD  carvedilol (COREG) 25 MG tablet Take 25 mg by mouth every evening.   Yes [provider]  diclofenac sodium (VOLTAREN) 1 % GEL Apply 4 g topically 4 (four) times daily as needed for pain. 12/11/16  Yes [provider]  dimenhyDRINATE (DRAMAMINE) 50 MG tablet Take 50 mg by mouth as needed for nausea.   Yes [provider]  diphenhydramine-acetaminophen (TYLENOL PM) 25-500 MG TABS tablet Take 1 tablet by mouth at bedtime.   Yes [provider]  donepezil (ARICEPT) 10 MG tablet Take 1 tablet (10 mg total) by mouth at bedtime. 12/17/16  Yes York Spaniel, MD  memantine [facility]) 5 MG tablet 5mg  in the am for 2 weeks then increase to 5mg  in the am and 5 mg in the pm 10/24/16  Yes Nilda Riggs, NP  potassium chloride SA (K-DUR,KLOR-CON) 20 MEQ tablet TAKE 1 TABLET BY MOUTH EVERY DAY Patient taking differently: TAKE TABLET BY MOUTH twice 07/15/15  Yes Cassell Clement, MD  sertraline (ZOLOFT) 50 MG tablet TAKE 3 TABLETS(150 MG) BY MOUTH DAILY 08/12/17  Yes York Spaniel, MD  carvedilol (COREG) 12.5 MG tablet Take 1 tablet (12.5 mg total) by mouth every morning. Patient not taking: Reported on 10/08/2017 03/16/16   Lorre Nick, MD    Physical Exam: Vitals:   10/08/17 1615 10/08/17 1700 10/08/17 1745 10/08/17 2230  BP: (!) 117/56 129/63 124/67 (!) 103/45  Pulse: (!) 103 (!) 110 (!) 118   Resp: (!) 27 18 (!) 21 (!) 24  Temp:      TempSrc:      SpO2: 95% 95% (!) 86%    General: Not in acute distress HEENT:       Eyes: PERRL, EOMI, no scleral icterus.       ENT: No discharge from the ears and nose, no pharynx injection, no tonsillar enlargement.        Neck: No JVD, no bruit, no mass felt. Heme: No neck lymph node enlargement. Cardiac: S1/S2, RRR, No murmurs, No gallops or rubs. Respiratory: No  rales, wheezing, rhonchi or rubs. GI: Soft, mildly distended, mild diffuse tenderness, no rebound pain, no organomegaly, BS present. GU: No hematuria Ext: No pitting leg edema bilaterally. 2+DP/PT pulse bilaterally. Musculoskeletal: No joint deformities, No joint redness or warmth, no limitation of ROM in spin. Skin: No rashes.  Neuro: Alert, oriented X3, cranial nerves II-XII grossly intact, moves all extremities normally. Psych: Patient is not psychotic, no suicidal or hemocidal ideation.  Labs on Admission: I have personally reviewed following labs and imaging studies  CBC: Recent Labs  Lab 10/08/17 1439  WBC 14.0*  NEUTROABS 13.4*  HGB 14.3  HCT 43.4  MCV 95.2  PLT 214   Basic Metabolic Panel: Recent Labs  Lab 10/08/17 1439 10/08/17 1906  NA 143  --   K 2.7*  --   CL 105  --  CO2 23  --   GLUCOSE 97  --   BUN 10  --   CREATININE 0.78  --   CALCIUM 8.7*  --   MG  --  1.7   GFR: CrCl cannot be calculated (Unknown ideal weight.). Liver Function Tests: Recent Labs  Lab 10/08/17 1439  AST 52*  ALT 28  ALKPHOS 71  BILITOT 1.1  PROT 6.1*  ALBUMIN 3.2*   Recent Labs  Lab 10/08/17 1439  LIPASE 26   No results for input(s): AMMONIA in the last 168 hours. Coagulation Profile: No results for input(s): INR, PROTIME in the last 168 hours. Cardiac Enzymes: No results for input(s): CKTOTAL, CKMB, CKMBINDEX, TROPONINI in the last 168 hours. BNP (last 3 results) No results for input(s): PROBNP in the last 8760 hours. HbA1C: No results for input(s): HGBA1C in the last 72 hours. CBG: No results for input(s): GLUCAP in the last 168 hours. Lipid Profile: No results for input(s): CHOL, HDL, LDLCALC, TRIG, CHOLHDL, LDLDIRECT in the last 72 hours. Thyroid Function Tests: No results for input(s): TSH, T4TOTAL, FREET4, T3FREE, THYROIDAB in the last 72 hours. Anemia Panel: No results for input(s): VITAMINB12, FOLATE, FERRITIN, TIBC, IRON, RETICCTPCT in the last 72  hours. Urine analysis:    Component Value Date/Time   COLORURINE YELLOW 10/08/2017 2159   APPEARANCEUR HAZY (A) 10/08/2017 2159   LABSPEC >1.046 (H) 10/08/2017 2159   PHURINE 6.0 10/08/2017 2159   GLUCOSEU NEGATIVE 10/08/2017 2159   HGBUR SMALL (A) 10/08/2017 2159   BILIRUBINUR NEGATIVE 10/08/2017 2159   KETONESUR 5 (A) 10/08/2017 2159   PROTEINUR NEGATIVE 10/08/2017 2159   NITRITE NEGATIVE 10/08/2017 2159   LEUKOCYTESUR LARGE (A) 10/08/2017 2159   Sepsis Labs: @LABRCNTIP (procalcitonin:4,lacticidven:4) )No results found for this or any previous visit (from the past 240 hour(s)).   Radiological Exams on Admission: Ct Abdomen Pelvis W Contrast  Result Date: 10/08/2017 CLINICAL DATA:  Nausea and vomiting.  Generalized weakness. EXAM: CT ABDOMEN AND PELVIS WITH CONTRAST TECHNIQUE: Multidetector CT imaging of the abdomen and pelvis was performed using the standard protocol following bolus administration of intravenous contrast. CONTRAST:  ISOVUE-300 IOPAMIDOL (ISOVUE-300) INJECTION 61% COMPARISON:  Abdominal radiographs from earlier today. FINDINGS: Lower chest: Scattered parenchymal bands at both lung bases compatible with mild scarring or atelectasis. Hepatobiliary: Diffuse hepatic steatosis. No definite liver surface irregularity. No liver mass. Small venovenous shunt in the inferior segment 3 left liver lobe (series 8/image 10). Normal gallbladder with no radiopaque cholelithiasis. No biliary ductal dilatation. Pancreas: Normal, with no mass or duct dilation. Spleen: Normal size. No mass. Adrenals/Urinary Tract: Normal adrenals. Hypodense subcentimeter interpolar left renal cortical focus is too small to characterize and requires no follow-up. Otherwise normal kidneys, with no hydronephrosis. Normal bladder. Stomach/Bowel: Normal non-distended stomach. There is a small infraumbilical ventral hernia to the right of midline containing a tiny portion of a mildly thick walled small bowel loop  (series 7/image 91 and series 6/images 33-37), proximal to which there a few mildly dilated small bowel loops with air-fluid levels measuring up to 4.0 cm diameter, and proximal to which the small bowel is relatively decompressed. No additional sites of small bowel wall thickening. Appendix not discretely visualized. No pericecal inflammatory changes. Postsurgical changes from partial distal colectomy with intact appearing distal colonic anastomosis. Mild diverticulosis in the remnant sigmoid colon. No large bowel wall thickening or significant pericolonic fat stranding. Vascular/Lymphatic: Atherosclerotic nonaneurysmal abdominal aorta. Patent portal, splenic, hepatic and renal veins. No pathologically enlarged lymph nodes in the abdomen  or pelvis. Reproductive: Status post hysterectomy, with no abnormal findings at the vaginal cuff. No adnexal mass. Other: No pneumoperitoneum, ascites or focal fluid collection. Small fat containing periumbilical hernia. Moderate fat containing midline supraumbilical ventral abdominal hernia (series 3/image 35). Musculoskeletal: No aggressive appearing focal osseous lesions. Marked thoracolumbar spondylosis. IMPRESSION: 1. Mechanical small-bowel obstruction in the mid small bowel at the level of a small infraumbilical ventral abdominal hernia to the right of midline, which contains a tiny portion of a mildly thick walled small bowel loop. Early ischemic changes cannot be excluded in this partially herniated small bowel loop. Surgical consultation advised. 2. No pneumatosis, free air or abscess. 3. Postsurgical changes from partial distal colectomy. Mild diverticulosis in the remnant sigmoid colon. 4. Chronic findings include: Aortic Atherosclerosis (ICD10-I70.0). Diffuse hepatic steatosis. Additional small fat containing periumbilical hernia and moderate fat containing midline supraumbilical ventral abdominal hernia. Electronically Signed   By: Delbert Phenix M.D.   On: 10/08/2017  18:32   Dg Chest Portable 1 View  Result Date: 10/08/2017 CLINICAL DATA:  Nausea and vomiting today. EXAM: PORTABLE CHEST 1 VIEW COMPARISON:  PA and lateral chest 04/13/2010.  CT chest 04/14/2010. FINDINGS: Heart size is upper normal. There is aortic atherosclerosis. Lungs are clear. No pneumothorax or pleural effusion. No acute bony abnormality. IMPRESSION: No acute disease. Electronically Signed   By: Drusilla Kanner M.D.   On: 10/08/2017 15:28   Dg Abd Portable 2 Views  Result Date: 10/08/2017 CLINICAL DATA:  Nausea and vomiting today. EXAM: PORTABLE ABDOMEN - 2 VIEW COMPARISON:  None. FINDINGS: The bowel gas pattern is normal. There is no evidence of free air. No radio-opaque calculi or other significant radiographic abnormality is seen. IMPRESSION: Negative exam. Electronically Signed   By: Drusilla Kanner M.D.   On: 10/08/2017 15:27     EKG: Independently reviewed. Sinus rhythm, QTC 471, LAD, poor R-wave progression Assessment/Plan Principal Problem:   NSTEMI (non-ST elevated myocardial infarction) (HCC) Active Problems:   Hypertension   Hypercholesteremia   Dementia   SBO (small bowel obstruction) (HCC)   Sepsis (HCC)   Hypokalemia   Alcohol use   UTI (urinary tract infection)   NSTEMI (non-ST elevated myocardial infarction) (HCC): trop is upto 7.28. Currently is chest pain free. Dr. Jacklyn Shell of card was consulted-->recommended to start IV heparin. Per Dr. Jacklyn Shell, pt will be treated medically, no invasive approach and no cardiac catheterization. I discussed with patient and her daughter about of goa of care. They are not ready to make decision tonight. They need to discuss with family members further, and make decision probably tomorrow. For now, her daughter wants patient to be full code.  -will admit to SDU as inpt -highly appreciate Dr. Jacklyn Shell recommendations -IV heparin was started in ED -f/u trop x 3 and 2d echo -A1c. FLP -ASA per rectum -hold lipitor due to NG tube  placement. -palliative consult  Hypokalemia: K=2.7  on admission. - Repleted with 10 mEQ of KCl by IV x 4 - give 1 g of magnesium sulfate - Check Mg level  Small bowel obstruction: Dr. Fredricka Bonine of general surgery was consulted. Pt is poor candidate for surgery. -highly appreciate Dr. Derrill Memo recommendations. -NG tube.  -IVF gently -palliative consult -hold off oral meds due to NG tube placement.  Dementia: On Namenda.  Previously quite agitated.  Settled with benzodiazepines. -hold oral namenda and donepezil due to NG tube placement  Alcohol use: 1.5 L of wine daily. -CIWA protocol  HTN:  -hold oral home medications: Amlodipine,  Coreg and Benicar -IV hydralazine prn -prn IV labetalol for tachycardia  Hypercholesteremia: -hold lipitor  Sepsis [facility]): Patient meets criteria for sepsis with leukocytosis, tachycardia and tachypnea. Lactic acid is elevated 2.94-->2.21. Likely due to small bowel obstruction. Patient does not have fever. We'll hold off antibiotics now. -will get Procalcitonin and trend lactic acid levels per sepsis protocol. -IVF: 500 mL x 2, followed by 100 cc/h  - Bx and Ux  Addendum: UA came back positive for UTI -will start rocephin IV  Depression: Stable, no suicidal or homicidal ideations. -hold off zoloft  DVT ppx: onm IV heparin Code Status: Full code (I discussed with patient and her daughter who is one the 3 POA about code status. They are not ready to make decision tonight. They need to discuss with family members further, and make decision probably tomorrow. For now, her daughter wants patient to be full code)  Family Communication: Yes, patient's  daguhter at bed side Disposition Plan:  Anticipate discharge back to previous home environment Consults called:  Gen. surgeon, Dr. Doylene Canard, cardiology, Dr. Jacklyn Shell Admission status:   SDU/inpation       Date of Service 10/08/2017    Lorretta Harp [facility] Pager [phone]  If 7PM-7AM, please  contact night-coverage [email or web address] Password [email or web address] 10/08/2017, 11:40 PM

## 2017-10-09 ENCOUNTER — Inpatient Hospital Stay (HOSPITAL_COMMUNITY): Payer: Medicare Other

## 2017-10-09 ENCOUNTER — Encounter (HOSPITAL_COMMUNITY): Payer: Self-pay

## 2017-10-09 ENCOUNTER — Other Ambulatory Visit: Payer: Self-pay

## 2017-10-09 DIAGNOSIS — F0151 Vascular dementia with behavioral disturbance: Secondary | ICD-10-CM

## 2017-10-09 DIAGNOSIS — E7800 Pure hypercholesterolemia, unspecified: Secondary | ICD-10-CM

## 2017-10-09 DIAGNOSIS — I340 Nonrheumatic mitral (valve) insufficiency: Secondary | ICD-10-CM

## 2017-10-09 DIAGNOSIS — E876 Hypokalemia: Secondary | ICD-10-CM

## 2017-10-09 LAB — LIPID PANEL
CHOL/HDL RATIO: 1.7 ratio
CHOLESTEROL: 150 mg/dL (ref 0–200)
HDL: 86 mg/dL (ref >40)
LDL Cholesterol: 49 mg/dL (ref 0–99)
TRIGLYCERIDES: 73 mg/dL (ref <150)
VLDL: 15 mg/dL (ref 0–40)

## 2017-10-09 LAB — BASIC METABOLIC PANEL
Anion gap: 12 (ref 5–15)
BUN: 14 mg/dL (ref 6–20)
CALCIUM: 8 mg/dL — AB (ref 8.9–10.3)
CHLORIDE: 102 mmol/L (ref 101–111)
CO2: 23 mmol/L (ref 22–32)
Creatinine, Ser: 0.67 mg/dL (ref 0.44–1.00)
GFR calc Af Amer: >60 mL/min (ref >60)
GFR calc non Af Amer: >60 mL/min (ref >60)
Glucose, Bld: 88 mg/dL (ref 65–99)
Potassium: 2.7 mmol/L — CL (ref 3.5–5.1)
SODIUM: 137 mmol/L (ref 135–145)

## 2017-10-09 LAB — TROPONIN I
Troponin I: 11.79 ng/mL (ref <0.03)
Troponin I: 12.99 ng/mL (ref <0.03)

## 2017-10-09 LAB — LACTIC ACID, PLASMA: LACTIC ACID, VENOUS: 1.1 mmol/L (ref 0.5–1.9)

## 2017-10-09 LAB — ECHOCARDIOGRAM COMPLETE: Weight: 2973.56 oz

## 2017-10-09 LAB — MAGNESIUM: MAGNESIUM: 2.3 mg/dL (ref 1.7–2.4)

## 2017-10-09 LAB — PROCALCITONIN: Procalcitonin: 43.23 ng/mL

## 2017-10-09 LAB — TYPE AND SCREEN
ABO/RH(D): A NEG
ANTIBODY SCREEN: NEGATIVE

## 2017-10-09 LAB — PROTIME-INR
INR: 1.15
Prothrombin Time: 14.7 seconds (ref 11.4–15.2)

## 2017-10-09 LAB — CBC
HEMATOCRIT: 40.7 % (ref 36.0–46.0)
Hemoglobin: 13 g/dL (ref 12.0–15.0)
MCH: 30.4 pg (ref 26.0–34.0)
MCHC: 31.9 g/dL (ref 30.0–36.0)
MCV: 95.3 fL (ref 78.0–100.0)
PLATELETS: 196 10*3/uL (ref 150–400)
RBC: 4.27 MIL/uL (ref 3.87–5.11)
RDW: 14.5 % (ref 11.5–15.5)
WBC: 16.8 10*3/uL — AB (ref 4.0–10.5)

## 2017-10-09 LAB — HEPARIN LEVEL (UNFRACTIONATED): HEPARIN UNFRACTIONATED: 0.38 [IU]/mL (ref 0.30–0.70)

## 2017-10-09 LAB — APTT: aPTT: 92 seconds — ABNORMAL HIGH (ref 24–36)

## 2017-10-09 LAB — HEMOGLOBIN A1C
Hgb A1c MFr Bld: 5.1 % (ref 4.8–5.6)
Mean Plasma Glucose: 99.67 mg/dL

## 2017-10-09 LAB — MRSA PCR SCREENING: MRSA by PCR: NEGATIVE

## 2017-10-09 LAB — GLUCOSE, CAPILLARY
GLUCOSE-CAPILLARY: 71 mg/dL (ref 65–99)
Glucose-Capillary: 86 mg/dL (ref 65–99)

## 2017-10-09 LAB — ABO/RH: ABO/RH(D): A NEG

## 2017-10-09 LAB — BRAIN NATRIURETIC PEPTIDE: B Natriuretic Peptide: 1779.5 pg/mL — ABNORMAL HIGH (ref 0.0–100.0)

## 2017-10-09 MED ORDER — POTASSIUM CHLORIDE 10 MEQ/100ML IV SOLN
10.0000 meq | INTRAVENOUS | Status: AC
  Administered 2017-10-09 (×5): 10 meq via INTRAVENOUS
  Filled 2017-10-09 (×4): qty 100

## 2017-10-09 MED ORDER — POTASSIUM CHLORIDE CRYS ER 20 MEQ PO TBCR
40.0000 meq | EXTENDED_RELEASE_TABLET | ORAL | Status: DC
  Administered 2017-10-09: 40 meq via ORAL
  Filled 2017-10-09: qty 2

## 2017-10-09 MED ORDER — LORAZEPAM 2 MG/ML IJ SOLN
0.5000 mg | Freq: Once | INTRAMUSCULAR | Status: AC
  Administered 2017-10-09: 0.5 mg via INTRAVENOUS
  Filled 2017-10-09: qty 1

## 2017-10-09 MED ORDER — LORAZEPAM 2 MG/ML IJ SOLN
0.5000 mg | Freq: Once | INTRAMUSCULAR | Status: AC
  Administered 2017-10-09: 0.5 mg via INTRAVENOUS

## 2017-10-09 MED ORDER — FOLIC ACID 5 MG/ML IJ SOLN
1.0000 mg | Freq: Every day | INTRAMUSCULAR | Status: DC
  Administered 2017-10-09 – 2017-10-10 (×2): 1 mg via INTRAVENOUS
  Filled 2017-10-09 (×2): qty 0.2

## 2017-10-09 MED ORDER — POTASSIUM CHLORIDE 10 MEQ/100ML IV SOLN
10.0000 meq | INTRAVENOUS | Status: DC
  Administered 2017-10-09 (×2): 10 meq via INTRAVENOUS
  Filled 2017-10-09 (×3): qty 100

## 2017-10-09 MED ORDER — DEXTROSE 10 % IV SOLN
INTRAVENOUS | Status: DC
  Administered 2017-10-09 – 2017-10-10 (×2): via INTRAVENOUS
  Filled 2017-10-09: qty 1000

## 2017-10-09 MED ORDER — METOPROLOL TARTRATE 5 MG/5ML IV SOLN
2.5000 mg | Freq: Four times a day (QID) | INTRAVENOUS | Status: DC
  Administered 2017-10-09 – 2017-10-10 (×4): 2.5 mg via INTRAVENOUS
  Filled 2017-10-09 (×4): qty 5

## 2017-10-09 NOTE — Progress Notes (Signed)
No charge note:  Thank you for Palliative consult referral.   Chart reviewed. Called and spoke with patient's daughter Harlene RamusBrenda Odum.   GOC meeting arranged for tomorrow 3/7 at 1pm.  Ocie BobKasie Mahan, AGNP-C Palliative Medicine  Please call Palliative Medicine team phone with any questions [phone]. For individual providers please see AMION.

## 2017-10-09 NOTE — Progress Notes (Addendum)
PROGRESS NOTE  Mary Harrison  AVW:[id number] DOB: 04-21-1931 DOA: 10/08/2017 PCP: Eliott NineLeague-Sobon, Jennifer, MD  Brief Narrative:   The patient is an [age] year old female with dementia, hypertension, hyperlipidemia, migraine headaches, depression, alcohol use who presented with nausea, vomiting, abdominal and chest pains.  She was found to have an NSTEMI initially and CT of abdomen and pelvis demonstrated a small bowel obstruction.  General surgery and cardiology were consulted.  Assessment & Plan:  Anterior STEMI apparent on EKG on 3/6, Troponin 12.99 on 3/6 -Appreciate cardiology assistance, anticipate medical management given comorbidities including dementia and small bowel obstruction -Continue rectal aspirin -Continue heparin drip, day 2 - scheduled IV metoprolol - ECHO: Moderate LV dysfunction, ejection fraction 35-40% with akinesis of the apical myocardium, grade 1 diastolic dysfunction, no prior for comparison - A1c 5.1 - Palliative to meet with family on 3/7 at 1pm  Hypokalemia: K still 2.7 despite IV potassium overnight - Magnesium 2.3 - IV potassium 60meq  - oral KCL 40meq x 1  - repeat in AM  Wheezing, likely due to acute systolic heart failure from MI and IVF -  As long as she is not in respiratory distress, will try to replete potassium before administering lasix -  CXR:  Vascular congestion  Small bowel obstruction with SIRS criteria, Pt is poor candidate for surgery.  Has bowel sounds.  No BM since admission  - appreciate General Surgery assistance -  Holding off on NG tube, patient not vomiting . -  No IVF unless concern for hypoglycemia until able to give lasix - hold off oral meds due to NG tube placement.  Dementia: On Namenda.Previously quite agitated. Settled with benzodiazepines. - hold oral namenda and donepezil  Alcohol use: 1.5 L of wine daily. - CIWAprotocol with IV folate and thiamine - agitation improved with ativan  HTN:  BP  stable -holding Amlodipine, Coreg and Benicar -IV hydralazine prn -prn IV labetalol for tachycardia  Hypercholesteremia: -hold lipitor  Possible UTI, urine culture not obtained -  Ceftriaxone x 3 days  Depression: Stable, no suicidal or homicidal ideations. -hold zoloft   DVT prophylaxis: Heparin drip Code Status: Full code Family Communication: Patient had 2 daughters at bedside Disposition Plan: Patient is from home but anticipate she will be discharged to skilled nursing facility if her SBO resolves.   Consultants:   General surgery  [facility]  Procedures:  None  Antimicrobials:  Anti-infectives (From admission, onward)   Start     Dose/Rate Route Frequency Ordered Stop   10/09/17 0000  cefTRIAXone (ROCEPHIN) 1 g in sodium chloride 0.9 % 100 mL IVPB     1 g 200 mL/hr over 30 Minutes Intravenous Every 24 hours 10/08/17 2338         Subjective:  Patient states she just wants to sleep.  She currently denies pain or shortness of breath.  Objective: Vitals:   10/08/17 2230 10/09/17 0008 10/09/17 0256 10/09/17 1200  BP: (!) 103/45 (!) 111/45 111/61 120/61  Pulse:   90 80  Resp: (!) 24  (!) 25 20  Temp:  98.7 F (37.1 C) 98.6 F (37 C)   TempSrc:  Oral Oral   SpO2:   97% 95%  Weight:  84.3 kg (185 lb 13.6 oz) 84.3 kg (185 lb 13.6 oz)     Intake/Output Summary (Last 24 hours) at 10/09/2017 1440 Last data filed at 10/09/2017 0500 Gross per 24 hour  Intake 850 ml  Output 200 ml  Net 650 ml  Filed Weights   10/09/17 0008 10/09/17 0256  Weight: 84.3 kg (185 lb 13.6 oz) 84.3 kg (185 lb 13.6 oz)    Examination:  General exam:  Adult female, rolled over in bed spontaneously during my visit.  No acute distress.  HEENT:  NCAT, MMM Respiratory system: Wheezing throughout, no focal rales or rhonchi Cardiovascular system: Regular rate and rhythm, normal S1/S2. No murmurs, rubs, gallops or clicks.  Warm extremities Gastrointestinal system: Hypoactive but  present bowel sounds, soft, nondistended, nontender. MSK:  Normal tone and bulk, no lower extremity edema Neuro:  Grossly moves all extremities   Data Reviewed: I have personally reviewed following labs and imaging studies  CBC: Recent Labs  Lab 10/08/17 1439 10/09/17 0607  WBC 14.0* 16.8*  NEUTROABS 13.4*  --   HGB 14.3 13.0  HCT 43.4 40.7  MCV 95.2 95.3  PLT 214 196   Basic Metabolic Panel: Recent Labs  Lab 10/08/17 1439 10/08/17 1906 10/09/17 0607 10/09/17 0847  NA 143  --   --  137  K 2.7*  --   --  2.7*  CL 105  --   --  102  CO2 23  --   --  23  GLUCOSE 97  --   --  88  BUN 10  --   --  14  CREATININE 0.78  --   --  0.67  CALCIUM 8.7*  --   --  8.0*  MG  --  1.7 2.3  --    GFR: CrCl cannot be calculated (Unknown ideal weight.). Liver Function Tests: Recent Labs  Lab 10/08/17 1439  AST 52*  ALT 28  ALKPHOS 71  BILITOT 1.1  PROT 6.1*  ALBUMIN 3.2*   Recent Labs  Lab 10/08/17 1439  LIPASE 26   No results for input(s): AMMONIA in the last 168 hours. Coagulation Profile: Recent Labs  Lab 10/09/17 0607  INR 1.15   Cardiac Enzymes: Recent Labs  Lab 10/09/17 0607 10/09/17 0847  TROPONINI 11.79* 12.99*   BNP (last 3 results) No results for input(s): PROBNP in the last 8760 hours. HbA1C: Recent Labs    10/09/17 0607  HGBA1C 5.1   CBG: Recent Labs  Lab 10/09/17 1332  GLUCAP 86   Lipid Profile: Recent Labs    10/09/17 0607  CHOL 150  HDL 86  LDLCALC 49  TRIG 73  CHOLHDL 1.7   Thyroid Function Tests: No results for input(s): TSH, T4TOTAL, FREET4, T3FREE, THYROIDAB in the last 72 hours. Anemia Panel: No results for input(s): VITAMINB12, FOLATE, FERRITIN, TIBC, IRON, RETICCTPCT in the last 72 hours. Urine analysis:    Component Value Date/Time   COLORURINE YELLOW 10/08/2017 2159   APPEARANCEUR HAZY (A) 10/08/2017 2159   LABSPEC >1.046 (H) 10/08/2017 2159   PHURINE 6.0 10/08/2017 2159   GLUCOSEU NEGATIVE 10/08/2017 2159    HGBUR SMALL (A) 10/08/2017 2159   BILIRUBINUR NEGATIVE 10/08/2017 2159   KETONESUR 5 (A) 10/08/2017 2159   PROTEINUR NEGATIVE 10/08/2017 2159   NITRITE NEGATIVE 10/08/2017 2159   LEUKOCYTESUR LARGE (A) 10/08/2017 2159   Sepsis Labs: @LABRCNTIP (procalcitonin:4,lacticidven:4)  ) Recent Results (from the past 240 hour(s))  MRSA PCR Screening     Status: None   Collection Time: 10/09/17  2:47 AM  Result Value Ref Range Status   MRSA by PCR NEGATIVE NEGATIVE Final    Comment:        The GeneXpert MRSA Assay (FDA approved for NASAL specimens only), is one component of a comprehensive MRSA  colonization surveillance program. It is not intended to diagnose MRSA infection nor to guide or monitor treatment for MRSA infections. Performed at [facility], [address], [place], [place] [address]       Radiology Studies: Ct Abdomen Pelvis W Contrast  Result Date: 10/08/2017 CLINICAL DATA:  Nausea and vomiting.  Generalized weakness. EXAM: CT ABDOMEN AND PELVIS WITH CONTRAST TECHNIQUE: Multidetector CT imaging of the abdomen and pelvis was performed using the standard protocol following bolus administration of intravenous contrast. CONTRAST:  ISOVUE-300 IOPAMIDOL (ISOVUE-300) INJECTION 61% COMPARISON:  Abdominal radiographs from earlier today. FINDINGS: Lower chest: Scattered parenchymal bands at both lung bases compatible with mild scarring or atelectasis. Hepatobiliary: Diffuse hepatic steatosis. No definite liver surface irregularity. No liver mass. Small venovenous shunt in the inferior segment 3 left liver lobe (series 8/image 10). Normal gallbladder with no radiopaque cholelithiasis. No biliary ductal dilatation. Pancreas: Normal, with no mass or duct dilation. Spleen: Normal size. No mass. Adrenals/Urinary Tract: Normal adrenals. Hypodense subcentimeter interpolar left renal cortical focus is too small to characterize and requires no follow-up. Otherwise normal kidneys, with no  hydronephrosis. Normal bladder. Stomach/Bowel: Normal non-distended stomach. There is a small infraumbilical ventral hernia to the right of midline containing a tiny portion of a mildly thick walled small bowel loop (series 7/image 91 and series 6/images 33-37), proximal to which there a few mildly dilated small bowel loops with air-fluid levels measuring up to 4.0 cm diameter, and proximal to which the small bowel is relatively decompressed. No additional sites of small bowel wall thickening. Appendix not discretely visualized. No pericecal inflammatory changes. Postsurgical changes from partial distal colectomy with intact appearing distal colonic anastomosis. Mild diverticulosis in the remnant sigmoid colon. No large bowel wall thickening or significant pericolonic fat stranding. Vascular/Lymphatic: Atherosclerotic nonaneurysmal abdominal aorta. Patent portal, splenic, hepatic and renal veins. No pathologically enlarged lymph nodes in the abdomen or pelvis. Reproductive: Status post hysterectomy, with no abnormal findings at the vaginal cuff. No adnexal mass. Other: No pneumoperitoneum, ascites or focal fluid collection. Small fat containing periumbilical hernia. Moderate fat containing midline supraumbilical ventral abdominal hernia (series 3/image 35). Musculoskeletal: No aggressive appearing focal osseous lesions. Marked thoracolumbar spondylosis. IMPRESSION: 1. Mechanical small-bowel obstruction in the mid small bowel at the level of a small infraumbilical ventral abdominal hernia to the right of midline, which contains a tiny portion of a mildly thick walled small bowel loop. Early ischemic changes cannot be excluded in this partially herniated small bowel loop. Surgical consultation advised. 2. No pneumatosis, free air or abscess. 3. Postsurgical changes from partial distal colectomy. Mild diverticulosis in the remnant sigmoid colon. 4. Chronic findings include: Aortic Atherosclerosis (ICD10-I70.0). Diffuse  hepatic steatosis. Additional small fat containing periumbilical hernia and moderate fat containing midline supraumbilical ventral abdominal hernia. Electronically Signed   By: Delbert Phenix M.D.   On: 10/08/2017 18:32   Dg Chest Port 1 View  Result Date: 10/09/2017 CLINICAL DATA:  Dyspnea. EXAM: PORTABLE CHEST 1 VIEW COMPARISON:  Radiograph of October 08, 2017. FINDINGS: Stable cardiomediastinal silhouette. Atherosclerosis of thoracic aorta is noted. Hypoinflation of the lungs is noted with mild bibasilar subsegmental atelectasis. Increased bilateral perihilar densities are noted which may represent edema or crowding of vascular structures due to hypoinflation. No pneumothorax or pleural effusion is noted. Bony thorax is unremarkable. IMPRESSION: Hypoinflation of the lungs is noted with mild bibasilar subsegmental atelectasis. Increased bilateral perihilar densities are noted which may represent mild edema or possibly crowding of vascular structures due to  hypoinflation. Aortic Atherosclerosis (ICD10-I70.0). Electronically Signed   By: Lupita Raider, M.D.   On: 10/09/2017 14:18   Dg Chest Portable 1 View  Result Date: 10/08/2017 CLINICAL DATA:  Nausea and vomiting today. EXAM: PORTABLE CHEST 1 VIEW COMPARISON:  PA and lateral chest 04/13/2010.  CT chest 04/14/2010. FINDINGS: Heart size is upper normal. There is aortic atherosclerosis. Lungs are clear. No pneumothorax or pleural effusion. No acute bony abnormality. IMPRESSION: No acute disease. Electronically Signed   By: Drusilla Kanner M.D.   On: 10/08/2017 15:28   Dg Abd Portable 2 Views  Result Date: 10/08/2017 CLINICAL DATA:  Nausea and vomiting today. EXAM: PORTABLE ABDOMEN - 2 VIEW COMPARISON:  None. FINDINGS: The bowel gas pattern is normal. There is no evidence of free air. No radio-opaque calculi or other significant radiographic abnormality is seen. IMPRESSION: Negative exam. Electronically Signed   By: Drusilla Kanner M.D.   On: 10/08/2017 15:27      Scheduled Meds: . aspirin  300 mg Rectal Daily  . folic acid  1 mg Oral Daily  . LORazepam  0-4 mg Intravenous Q6H   Followed by  . [START ON 10/10/2017] LORazepam  0-4 mg Intravenous Q12H  . metoprolol tartrate  2.5 mg Intravenous Q6H  . multivitamin with minerals  1 tablet Oral Daily  . potassium chloride  40 mEq Oral Q4H  . thiamine  100 mg Oral Daily   Or  . thiamine  100 mg Intravenous Daily   Continuous Infusions: . cefTRIAXone (ROCEPHIN)  IV Stopped (10/09/17 0210)  . heparin Stopped (10/08/17 2219)  . magnesium sulfate 1 - 4 g bolus IVPB Stopped (10/08/17 2219)  . sodium chloride       LOS: 1 day    Time spent: 30 min    Renae Fickle, MD [facility] Pager [phone]  If 7PM-7AM, please contact night-coverage [email or web address] Password [email or web address] 10/09/2017, 2:40 PM

## 2017-10-09 NOTE — Progress Notes (Signed)
Patient was more relaxed after 0300 today.  It was not necessary to put her on soft restraints.  She slept throughout the rest of the night w/o any other incidents.  Soft restraint order has been d/c.

## 2017-10-09 NOTE — Progress Notes (Signed)
Progress Note  Patient Name: Mary Harrison Date of Encounter: 10/09/2017  Primary Cardiologist: No primary care provider on file.   Subjective   Sleepy now in bed. Daughter and granddaughter Ginger (nurse with ortho) in room. No CP, no SOB  Inpatient Medications    Scheduled Meds: . aspirin  300 mg Rectal Daily  . folic acid  1 mg Oral Daily  . LORazepam  0-4 mg Intravenous Q6H   Followed by  . [START ON 10/10/2017] LORazepam  0-4 mg Intravenous Q12H  . multivitamin with minerals  1 tablet Oral Daily  . thiamine  100 mg Oral Daily   Or  . thiamine  100 mg Intravenous Daily   Continuous Infusions: . sodium chloride    . cefTRIAXone (ROCEPHIN)  IV Stopped (10/09/17 0210)  . heparin Stopped (10/08/17 2219)  . magnesium sulfate 1 - 4 g bolus IVPB Stopped (10/08/17 2219)  . sodium chloride     PRN Meds: acetaminophen, hydrALAZINE, labetalol, LORazepam **OR** LORazepam, morphine injection, nitroGLYCERIN, ondansetron (ZOFRAN) IV, zolpidem   Vital Signs    Vitals:   10/08/17 1745 10/08/17 2230 10/09/17 0008 10/09/17 0256  BP: 124/67 (!) 103/45 (!) 111/45 111/61  Pulse: (!) 118   90  Resp: (!) 21 (!) 24  (!) 25  Temp:   98.7 F (37.1 C) 98.6 F (37 C)  TempSrc:   Oral Oral  SpO2: (!) 86%   97%  Weight:   185 lb 13.6 oz (84.3 kg) 185 lb 13.6 oz (84.3 kg)    Intake/Output Summary (Last 24 hours) at 10/09/2017 0844 Last data filed at 10/09/2017 0500 Gross per 24 hour  Intake 850 ml  Output 200 ml  Net 650 ml   Filed Weights   10/09/17 0008 10/09/17 0256  Weight: 185 lb 13.6 oz (84.3 kg) 185 lb 13.6 oz (84.3 kg)    Telemetry    NSR, /ST - Personally Reviewed  ECG    ST segment elevation anterior leads, NSR - Personally Reviewed  Physical Exam   GEN: No acute distress, in bed, sedated with Ativan (CIWA).   Neck: No JVD Cardiac: RRR, no murmurs, rubs, or gallops.  Respiratory: Clear to auscultation bilaterally. GI: Soft, nontender, non-distended  MS: No  edema; No deformity. Neuro:  Nonfocal  Psych: Currently sedate  Labs    Chemistry Recent Labs  Lab 10/08/17 1439  NA 143  K 2.7*  CL 105  CO2 23  GLUCOSE 97  BUN 10  CREATININE 0.78  CALCIUM 8.7*  PROT 6.1*  ALBUMIN 3.2*  AST 52*  ALT 28  ALKPHOS 71  BILITOT 1.1  GFRNONAA >60  GFRAA >60  ANIONGAP 15     Hematology Recent Labs  Lab 10/08/17 1439 10/09/17 0607  WBC 14.0* 16.8*  RBC 4.56 4.27  HGB 14.3 13.0  HCT 43.4 40.7  MCV 95.2 95.3  MCH 31.4 30.4  MCHC 32.9 31.9  RDW 14.2 14.5  PLT 214 196    Cardiac Enzymes Recent Labs  Lab 10/09/17 0607  TROPONINI 11.79*    Recent Labs  Lab 10/08/17 1443 10/08/17 1744  TROPIPOC 4.22* 7.28*     BNP Recent Labs  Lab 10/09/17 0607  BNP 1,779.5*     DDimer No results for input(s): DDIMER in the last 168 hours.   Radiology    Ct Abdomen Pelvis W Contrast  Result Date: 10/08/2017 CLINICAL DATA:  Nausea and vomiting.  Generalized weakness. EXAM: CT ABDOMEN AND PELVIS WITH CONTRAST TECHNIQUE: Multidetector  CT imaging of the abdomen and pelvis was performed using the standard protocol following bolus administration of intravenous contrast. CONTRAST:  ISOVUE-300 IOPAMIDOL (ISOVUE-300) INJECTION 61% COMPARISON:  Abdominal radiographs from earlier today. FINDINGS: Lower chest: Scattered parenchymal bands at both lung bases compatible with mild scarring or atelectasis. Hepatobiliary: Diffuse hepatic steatosis. No definite liver surface irregularity. No liver mass. Small venovenous shunt in the inferior segment 3 left liver lobe (series 8/image 10). Normal gallbladder with no radiopaque cholelithiasis. No biliary ductal dilatation. Pancreas: Normal, with no mass or duct dilation. Spleen: Normal size. No mass. Adrenals/Urinary Tract: Normal adrenals. Hypodense subcentimeter interpolar left renal cortical focus is too small to characterize and requires no follow-up. Otherwise normal kidneys, with no hydronephrosis.  Normal bladder. Stomach/Bowel: Normal non-distended stomach. There is a small infraumbilical ventral hernia to the right of midline containing a tiny portion of a mildly thick walled small bowel loop (series 7/image 91 and series 6/images 33-37), proximal to which there a few mildly dilated small bowel loops with air-fluid levels measuring up to 4.0 cm diameter, and proximal to which the small bowel is relatively decompressed. No additional sites of small bowel wall thickening. Appendix not discretely visualized. No pericecal inflammatory changes. Postsurgical changes from partial distal colectomy with intact appearing distal colonic anastomosis. Mild diverticulosis in the remnant sigmoid colon. No large bowel wall thickening or significant pericolonic fat stranding. Vascular/Lymphatic: Atherosclerotic nonaneurysmal abdominal aorta. Patent portal, splenic, hepatic and renal veins. No pathologically enlarged lymph nodes in the abdomen or pelvis. Reproductive: Status post hysterectomy, with no abnormal findings at the vaginal cuff. No adnexal mass. Other: No pneumoperitoneum, ascites or focal fluid collection. Small fat containing periumbilical hernia. Moderate fat containing midline supraumbilical ventral abdominal hernia (series 3/image 35). Musculoskeletal: No aggressive appearing focal osseous lesions. Marked thoracolumbar spondylosis. IMPRESSION: 1. Mechanical small-bowel obstruction in the mid small bowel at the level of a small infraumbilical ventral abdominal hernia to the right of midline, which contains a tiny portion of a mildly thick walled small bowel loop. Early ischemic changes cannot be excluded in this partially herniated small bowel loop. Surgical consultation advised. 2. No pneumatosis, free air or abscess. 3. Postsurgical changes from partial distal colectomy. Mild diverticulosis in the remnant sigmoid colon. 4. Chronic findings include: Aortic Atherosclerosis (ICD10-I70.0). Diffuse hepatic  steatosis. Additional small fat containing periumbilical hernia and moderate fat containing midline supraumbilical ventral abdominal hernia. Electronically Signed   By: Delbert Phenix M.D.   On: 10/08/2017 18:32   Dg Chest Portable 1 View  Result Date: 10/08/2017 CLINICAL DATA:  Nausea and vomiting today. EXAM: PORTABLE CHEST 1 VIEW COMPARISON:  PA and lateral chest 04/13/2010.  CT chest 04/14/2010. FINDINGS: Heart size is upper normal. There is aortic atherosclerosis. Lungs are clear. No pneumothorax or pleural effusion. No acute bony abnormality. IMPRESSION: No acute disease. Electronically Signed   By: Drusilla Kanner M.D.   On: 10/08/2017 15:28   Dg Abd Portable 2 Views  Result Date: 10/08/2017 CLINICAL DATA:  Nausea and vomiting today. EXAM: PORTABLE ABDOMEN - 2 VIEW COMPARISON:  None. FINDINGS: The bowel gas pattern is normal. There is no evidence of free air. No radio-opaque calculi or other significant radiographic abnormality is seen. IMPRESSION: Negative exam. Electronically Signed   By: Drusilla Kanner M.D.   On: 10/08/2017 15:27    Cardiac Studies   ECHO pending  Patient Profile     84 y.o. female with myocardial infarction (trop 11), anterior ST changes on ECG, ETOH use, dementia.  Assessment & Plan    Acute myocardial infarction  - Trop 4 on admit, to 7, now 11. Treating with Heparin IV started 3/5 9pm. Will continue for at least 48hours. Asa when able to tolerate. Will give low dose IV metoprolol 2.5mg  IV QID. Start statin when able to take PO. No Plavix for now with possible general surgery needs.   - Await ECHO  - Continue non invasive mgt. Family all in agreement with no cath.   - She is at high mortality risk  Dementia/ETOH  - CIWA, ativan. Quite agitated earlier  - Palliative care discussion - goals of care  SBO  - Dr. Cliffton Asters and I discussed. Hopefully will continue to improve without surgical intervention (not a good surgical candidate - high risk).    Hypokalemia  - replete  Lactic acidosis  - likely hypoperfusion.   - OK with IV fluids. If shows signs of hypoxia stop. Checking EF   For questions or updates, please contact [facility] Please consult [email or web address] for contact info under Cardiology/STEMI.      Signed, Donato Schultz, MD  10/09/2017, 8:44 AM

## 2017-10-09 NOTE — Progress Notes (Signed)
  Echocardiogram 2D Echocardiogram has been performed.  Gildo Crisco T Tarita Deshmukh 10/09/2017, 10:42 AM

## 2017-10-09 NOTE — Progress Notes (Signed)
Pt found to have adventitious lung sounds. Continuous fluids turned off and order d/c'd. WCTM.

## 2017-10-09 NOTE — Progress Notes (Signed)
Text [facility] on call, stated of attempts of NG tube by ED RN with unsuccessful try.  Family stated that they will wait for pallative consult. Patient is in bed, calm, and asleep.  Will keep monitoring patient.

## 2017-10-09 NOTE — Progress Notes (Signed)
Patient is agitated and anxious.  She is trying to get out of bed and pulling wires off.  We have put some mitten restraints on patient for her own safety.  I will keep monitoring patient.

## 2017-10-09 NOTE — Progress Notes (Signed)
Confirmed with Dr Anne FuSkains @ pt bedside that it was appropriate to start the continuous fluid order before Echo is completed. Dr. Anne FuSkains said it was fine to start continuous fluids, but to monitor patient's respiratory status. WCTM

## 2017-10-09 NOTE — Progress Notes (Signed)
Subjective No acute events. Feeling well this morning. She denies any abdominal pain.  Objective: Vital signs in last 24 hours: Temp:  [98 F (36.7 C)-98.7 F (37.1 C)] 98.6 F (37 C) (03/06 0256) Pulse Rate:  [90-124] 90 (03/06 0256) Resp:  [18-27] 25 (03/06 0256) BP: (103-135)/(45-71) 111/61 (03/06 0256) SpO2:  [86 %-97 %] 97 % (03/06 0256) Weight:  [84.3 kg (185 lb 13.6 oz)] 84.3 kg (185 lb 13.6 oz) (03/06 0256) Last BM Date: 10/08/17  Intake/Output from previous day: 03/05 0701 - 03/06 0700 In: 850 [P.O.:100; I.V.:100; IV Piggyback:650] Out: 200 [Urine:200] Intake/Output this shift: No intake/output data recorded.  Gen: NAD, comfortable CV: RRR Pulm: Normal work of breathing Abd: Obese, soft, NT/ND; all hernias appear to be reducible on exam. There is no tenderness to reduction of these including the one just off midline on the right side which was thought to potentially contain a partial loop of bowel. No overlying skin changes. Ext: SCDs in place  Lab Results: CBC  Recent Labs    10/08/17 1439 10/09/17 0607  WBC 14.0* 16.8*  HGB 14.3 13.0  HCT 43.4 40.7  PLT 214 196   BMET Recent Labs    10/08/17 1439  NA 143  K 2.7*  CL 105  CO2 23  GLUCOSE 97  BUN 10  CREATININE 0.78  CALCIUM 8.7*   PT/INR Recent Labs    10/09/17 0607  LABPROT 14.7  INR 1.15   ABG No results for input(s): PHART, HCO3 in the last 72 hours.  Invalid input(s): PCO2, PO2  Studies/Results:  Anti-infectives: Anti-infectives (From admission, onward)   Start     Dose/Rate Route Frequency Ordered Stop   10/09/17 0000  cefTRIAXone (ROCEPHIN) 1 g in sodium chloride 0.9 % 100 mL IVPB     1 g 200 mL/hr over 30 Minutes Intravenous Every 24 hours 10/08/17 2338         Assessment/Plan: Patient Active Problem List   Diagnosis Date Noted  . NSTEMI (non-ST elevated myocardial infarction) (HCC) 10/08/2017  . SBO (small bowel obstruction) (HCC) 10/08/2017  . Sepsis (HCC) 10/08/2017   . Hypokalemia 10/08/2017  . Alcohol use 10/08/2017  . UTI (urinary tract infection) 10/08/2017  . Memory deficits 05/11/2015  . Acute pharyngitis 12/07/2014  . Insomnia 07/15/2013  . Dizziness 05/21/2013  . Weakness generalized 05/04/2011  . Palpitation 12/08/2010  . Hypertension   . Hypercholesteremia   . Headache   . Dyslipidemia   . Dementia    86yoF with multiple active issues including NSTEMI and there was concern on CT for possible SBO.  -NPO, IVF, await reliable return of bowel function. Daughter states she was having diarrhea in the commode yesterday when EMS arrived. -Patient is refusing NG tube; if emesis, would recommend placement -Agree with palliative care consultation -Will continue to follow with you   LOS: 1 day   Stephanie Couphristopher M. Cliffton AstersWhite, M.D. General and Colorectal [facility]

## 2017-10-09 NOTE — Progress Notes (Addendum)
ANTICOAGULATION CONSULT NOTE  Pharmacy Consult for Heparin Indication: chest pain/ACS  Allergies  Allergen Reactions  . Mevacor [Lovastatin]     Leg cramps  . Olanzapine     Ringing in the ears  . Paxil [Paroxetine Hcl]     nausea  . Namenda [Memantine Hcl] Other (See Comments)    headache  . Toprol Xl [Metoprolol Succinate] Palpitations    Patient Measurements: Ideal Body Weight: 55 kg  Heparin Dosing Weight: 74 kg   Vital Signs: Temp: 98.6 F (37 C) (03/06 0256) Temp Source: Oral (03/06 0256) BP: 111/61 (03/06 0256) Pulse Rate: 90 (03/06 0256)  Labs: Recent Labs    10/08/17 1439 10/09/17 0607  HGB 14.3 13.0  HCT 43.4 40.7  PLT 214 196  APTT  --  92*  LABPROT  --  14.7  INR  --  1.15  HEPARINUNFRC  --  0.38  CREATININE 0.78  --   TROPONINI  --  11.79*    CrCl cannot be calculated (Unknown ideal weight.).   Medical History: Past Medical History:  Diagnosis Date  . Dementia   . Depression   . Dyslipidemia   . Headache(784.0)   . Hypercholesteremia   . Hypertension   . Memory deficits 05/11/2015  . Migraine   . Normal nuclear stress test 2005  . Palpitation     Medications:  Medications Prior to Admission  Medication Sig Dispense Refill Last Dose  . amLODipine (NORVASC) 10 MG tablet TAKE 1 TABLET BY MOUTH EVERY DAY (Patient taking differently: TAKE 10mg  BY MOUTH EVERy evening) 90 tablet 0 10/06/2017  . Aspirin-Salicylamide-Caffeine (BC HEADACHE POWDER PO) Take 1 packet by mouth 2 (two) times daily as needed (pain).   Past Week at Unknown time  . atorvastatin (LIPITOR) 20 MG tablet TAKE 1/2 TABLET BY MOUTH EVERY DAY (Patient taking differently: TAKE 10 MG BY MOUTH TWICE DAILY) 15 tablet 5 10/06/2017  . BENICAR HCT 40-12.5 MG tablet TAKE 1 TABLET BY MOUTH EVERY DAY 90 tablet 0 10/08/2017 at 1030  . carvedilol (COREG) 25 MG tablet Take 25 mg by mouth every evening.   10/06/2017  . diclofenac sodium (VOLTAREN) 1 % GEL Apply 4 g topically 4 (four) times daily as  needed for pain.   Past Week at Unknown time  . dimenhyDRINATE (DRAMAMINE) 50 MG tablet Take 50 mg by mouth as needed for nausea.   10/08/2017 at Unknown time  . diphenhydramine-acetaminophen (TYLENOL PM) 25-500 MG TABS tablet Take 1 tablet by mouth at bedtime.   10/07/2017 at Unknown time  . donepezil (ARICEPT) 10 MG tablet Take 1 tablet (10 mg total) by mouth at bedtime. 30 tablet 5 10/06/2017  . memantine (NAMENDA) 5 MG tablet 5mg  in the am for 2 weeks then increase to 5mg  in the am and 5 mg in the pm 60 tablet 6 10/06/2017  . potassium chloride SA (K-DUR,KLOR-CON) 20 MEQ tablet TAKE 1 TABLET BY MOUTH EVERY DAY (Patient taking differently: TAKE 10meq TABLET BY MOUTH twice) 30 tablet 1 10/06/2017  . sertraline (ZOLOFT) 50 MG tablet TAKE 3 TABLETS(150 MG) BY MOUTH DAILY 90 tablet 0 10/06/2017  . carvedilol (COREG) 12.5 MG tablet Take 1 tablet (12.5 mg total) by mouth every morning. (Patient not taking: Reported on 10/08/2017) 30 tablet 0 Not Taking at Unknown time    Assessment: [age] YOF here with vomiting, diaphoresis and generalized weakness. Patient's troponin continues to trend up. Pharmacy consulted to start IV heparin for ACS. She is not on any anticoagulation at  home. Heparin level therapeutic. H/H and Plt wnl. No bleed documented. No intervention per Cardiology and will continue heparin x 48 hours.  Goal of Therapy:  Heparin level 0.3-0.7 units/ml Monitor platelets by anticoagulation protocol: Yes   Plan:  -Continue IV heparin at 900 units/hr x 48 hours per Cardiology -Monitor daily CBC, heparin level, and s/s of bleeding  Babs Bertin, PharmD, BCPS Clinical Pharmacist Clinical phone for 10/09/2017 until 3:30pm: [phone] If after 3:30pm, please call main pharmacy at: [phone] 10/09/2017 9:06 AM

## 2017-10-09 NOTE — Progress Notes (Signed)
I gave pt's daughters on their cell phone on speaker the results of echo, and that we would slowly increase the meds.  Dr. Anne FuSkains will also review in the AM to decide on any other treatment.  I did order limited echo with definity to eval for apical thrombus.  Daughters made aware.

## 2017-10-09 NOTE — Progress Notes (Signed)
Paitent is getting more confuse, trying to pull wires out and wanting to get out of bed.  I have text MD on call to see if orders for wrist restraints can be put in place for patient's safety.

## 2017-10-10 ENCOUNTER — Inpatient Hospital Stay (HOSPITAL_COMMUNITY): Payer: Medicare Other

## 2017-10-10 ENCOUNTER — Other Ambulatory Visit: Payer: Self-pay

## 2017-10-10 DIAGNOSIS — I10 Essential (primary) hypertension: Secondary | ICD-10-CM

## 2017-10-10 DIAGNOSIS — Z789 Other specified health status: Secondary | ICD-10-CM

## 2017-10-10 DIAGNOSIS — Z7189 Other specified counseling: Secondary | ICD-10-CM

## 2017-10-10 LAB — BASIC METABOLIC PANEL
Anion gap: 16 — ABNORMAL HIGH (ref 5–15)
BUN: 12 mg/dL (ref 6–20)
CALCIUM: 8.5 mg/dL — AB (ref 8.9–10.3)
CO2: 17 mmol/L — AB (ref 22–32)
Chloride: 103 mmol/L (ref 101–111)
Creatinine, Ser: 0.59 mg/dL (ref 0.44–1.00)
GFR calc Af Amer: >60 mL/min (ref >60)
GLUCOSE: 105 mg/dL — AB (ref 65–99)
Potassium: 4.2 mmol/L (ref 3.5–5.1)
Sodium: 136 mmol/L (ref 135–145)

## 2017-10-10 LAB — CBC
HEMATOCRIT: 44.4 % (ref 36.0–46.0)
Hemoglobin: 14 g/dL (ref 12.0–15.0)
MCH: 30.5 pg (ref 26.0–34.0)
MCHC: 31.5 g/dL (ref 30.0–36.0)
MCV: 96.7 fL (ref 78.0–100.0)
PLATELETS: 160 10*3/uL (ref 150–400)
RBC: 4.59 MIL/uL (ref 3.87–5.11)
RDW: 14.5 % (ref 11.5–15.5)
WBC: 11.5 10*3/uL — AB (ref 4.0–10.5)

## 2017-10-10 LAB — ECHOCARDIOGRAM LIMITED: WEIGHTICAEL: 3049.6 [oz_av]

## 2017-10-10 LAB — GLUCOSE, CAPILLARY
GLUCOSE-CAPILLARY: 108 mg/dL — AB (ref 65–99)
Glucose-Capillary: 138 mg/dL — ABNORMAL HIGH (ref 65–99)
Glucose-Capillary: 113 mg/dL — ABNORMAL HIGH (ref 65–99)
Glucose-Capillary: 129 mg/dL — ABNORMAL HIGH (ref 65–99)
Glucose-Capillary: 119 mg/dL — ABNORMAL HIGH (ref 65–99)
Glucose-Capillary: 122 mg/dL — ABNORMAL HIGH (ref 65–99)

## 2017-10-10 LAB — HEPARIN LEVEL (UNFRACTIONATED): Heparin Unfractionated: 0.1 IU/mL — ABNORMAL LOW (ref 0.30–0.70)

## 2017-10-10 MED ORDER — SERTRALINE HCL 50 MG PO TABS
50.0000 mg | ORAL_TABLET | Freq: Every day | ORAL | Status: DC
  Administered 2017-10-11: 50 mg via ORAL
  Filled 2017-10-10: qty 1

## 2017-10-10 MED ORDER — LORAZEPAM 2 MG/ML IJ SOLN
0.5000 mg | INTRAMUSCULAR | Status: DC | PRN
  Administered 2017-10-10: 0.5 mg via INTRAVENOUS
  Filled 2017-10-10: qty 1

## 2017-10-10 MED ORDER — HALOPERIDOL LACTATE 5 MG/ML IJ SOLN
0.5000 mg | INTRAMUSCULAR | Status: DC | PRN
  Administered 2017-10-10: 0.5 mg via INTRAVENOUS
  Filled 2017-10-10: qty 1

## 2017-10-10 MED ORDER — LORAZEPAM 2 MG/ML IJ SOLN: 0.5000 mg | Freq: Once | INTRAMUSCULAR | Status: DC

## 2017-10-10 MED ORDER — GLYCOPYRROLATE 0.2 MG/ML IJ SOLN: 0.2000 mg | INTRAMUSCULAR | Status: DC | PRN

## 2017-10-10 MED ORDER — HALOPERIDOL LACTATE 2 MG/ML PO CONC
0.5000 mg | ORAL | Status: DC | PRN
  Filled 2017-10-10: qty 0.3

## 2017-10-10 MED ORDER — MORPHINE SULFATE (CONCENTRATE) 10 MG/0.5ML PO SOLN
5.0000 mg | ORAL | Status: DC | PRN
  Administered 2017-10-11: 5 mg via ORAL
  Filled 2017-10-10: qty 0.5

## 2017-10-10 MED ORDER — HALOPERIDOL 0.5 MG PO TABS
0.5000 mg | ORAL_TABLET | ORAL | Status: DC | PRN
Start: 2017-10-10 — End: 2017-10-11
  Filled 2017-10-10: qty 1

## 2017-10-10 MED ORDER — PERFLUTREN LIPID MICROSPHERE
INTRAVENOUS | Status: AC
  Administered 2017-10-10: 2 mL
  Filled 2017-10-10: qty 10

## 2017-10-10 MED ORDER — METOPROLOL TARTRATE 5 MG/5ML IV SOLN
5.0000 mg | Freq: Four times a day (QID) | INTRAVENOUS | Status: DC
  Administered 2017-10-10 – 2017-10-11 (×5): 5 mg via INTRAVENOUS
  Filled 2017-10-10 (×5): qty 5

## 2017-10-10 MED ORDER — MORPHINE SULFATE (CONCENTRATE) 10 MG/0.5ML PO SOLN: 5.0000 mg | ORAL | Status: DC | PRN

## 2017-10-10 MED ORDER — DIPHENHYDRAMINE HCL 25 MG PO CAPS
25.0000 mg | ORAL_CAPSULE | Freq: Every evening | ORAL | Status: DC | PRN
  Administered 2017-10-10: 25 mg via ORAL
  Filled 2017-10-10: qty 1

## 2017-10-10 MED ORDER — GLYCOPYRROLATE 1 MG PO TABS: 1.0000 mg | ORAL_TABLET | ORAL | Status: DC | PRN

## 2017-10-10 MED ORDER — MORPHINE SULFATE (PF) 4 MG/ML IV SOLN
1.0000 mg | INTRAVENOUS | Status: DC | PRN
  Administered 2017-10-11: 1 mg via INTRAVENOUS
  Filled 2017-10-10: qty 1

## 2017-10-10 NOTE — Progress Notes (Signed)
Subjective No acute events. CIWA - she is quite sedate. She denies any abdominal pain whatsoever or any other complaints this morning. No n/v reported by nursing.  Objective: Vital signs in last 24 hours: Temp:  [97.7 F (36.5 C)-98.7 F (37.1 C)] 98.5 F (36.9 C) (03/07 0756) Pulse Rate:  [76-92] 87 (03/07 0756) Resp:  [19-28] 28 (03/07 0756) BP: (101-128)/(51-85) 122/67 (03/07 0756) SpO2:  [95 %-98 %] 95 % (03/07 0756) Weight:  [86.5 kg (190 lb 9.6 oz)] 86.5 kg (190 lb 9.6 oz) (03/07 0435) Last BM Date: 10/08/16  Intake/Output from previous day: 03/06 0701 - 03/07 0700 In: 769.6 [I.V.:369.6; IV Piggyback:400] Out: 1000 [Urine:1000] Intake/Output this shift: No intake/output data recorded.  Gen: NAD, comfortable CV: RRR Pulm: Normal work of breathing Abd: Obese, soft, NT/ND; all hernias appear to be reducible on exam. There is no tenderness to reduction of these including the one just off midline on the right side which was thought to potentially contain a partial loop of bowel. No overlying skin changes. Ext: SCDs in place  Lab Results: CBC  Recent Labs    10/08/17 1439 10/09/17 0607  WBC 14.0* 16.8*  HGB 14.3 13.0  HCT 43.4 40.7  PLT 214 196   BMET Recent Labs    10/09/17 0847 10/10/17 0728  NA 137 136  K 2.7* 4.2  CL 102 103  CO2 23 17*  GLUCOSE 88 105*  BUN 14 12  CREATININE 0.67 0.59  CALCIUM 8.0* 8.5*   PT/INR Recent Labs    10/09/17 0607  LABPROT 14.7  INR 1.15   ABG No results for input(s): PHART, HCO3 in the last 72 hours.  Invalid input(s): PCO2, PO2  Studies/Results:  Anti-infectives: Anti-infectives (From admission, onward)   Start     Dose/Rate Route Frequency Ordered Stop   10/09/17 0000  cefTRIAXone (ROCEPHIN) 1 g in sodium chloride 0.9 % 100 mL IVPB     1 g 200 mL/hr over 30 Minutes Intravenous Every 24 hours 10/08/17 2338         Assessment/Plan: Patient Active Problem List   Diagnosis Date Noted  . NSTEMI (non-ST  elevated myocardial infarction) (HCC) 10/08/2017  . SBO (small bowel obstruction) (HCC) 10/08/2017  . Sepsis (HCC) 10/08/2017  . Hypokalemia 10/08/2017  . Alcohol use 10/08/2017  . UTI (urinary tract infection) 10/08/2017  . Memory deficits 05/11/2015  . Acute pharyngitis 12/07/2014  . Insomnia 07/15/2013  . Dizziness 05/21/2013  . Weakness generalized 05/04/2011  . Palpitation 12/08/2010  . Hypertension   . Hypercholesteremia   . Headache   . Dyslipidemia   . Dementia    86yoF with multiple active issues including NSTEMI and there was concern on CT for possible SBO.  -Ok for ice chips and PO meds -If emesis, would recommend NG tube placement -Palliative care following - planning goals of care meeting today at 1pm. -Will continue to follow with you   LOS: 2 days   Stephanie Couphristopher M. Cliffton AstersWhite, M.D. General and Colorectal Surgery [facility]

## 2017-10-10 NOTE — Progress Notes (Signed)
Progress Note  Patient Name: Mary Harrison Date of Encounter: 10/10/2017  Primary Cardiologist: No primary care provider on file.   Subjective   Confused, agitated at times, no CP, no SOB  Inpatient Medications    Scheduled Meds: . aspirin  300 mg Rectal Daily  . folic acid  1 mg Intravenous Daily  . LORazepam  0-4 mg Intravenous Q6H   Followed by  . LORazepam  0-4 mg Intravenous Q12H  . LORazepam  0.5 mg Intravenous Once  . metoprolol tartrate  2.5 mg Intravenous Q6H  . thiamine  100 mg Oral Daily   Or  . thiamine  100 mg Intravenous Daily   Continuous Infusions: . cefTRIAXone (ROCEPHIN)  IV Stopped (10/10/17 0046)  . dextrose 50 mL/hr at 10/09/17 2035  . heparin 900 Units/hr (10/09/17 1728)  . magnesium sulfate 1 - 4 g bolus IVPB Stopped (10/08/17 2219)  . sodium chloride     PRN Meds: acetaminophen, hydrALAZINE, labetalol, morphine injection, nitroGLYCERIN, ondansetron (ZOFRAN) IV   Vital Signs    Vitals:   10/09/17 2337 10/10/17 0131 10/10/17 0435 10/10/17 0756  BP: 110/60 (!) 101/51 112/61 122/67  Pulse: 90 85 76 87  Resp: 20 (!) 21 (!) 21 (!) 28  Temp: 98 F (36.7 C) 98.4 F (36.9 C) 98.7 F (37.1 C) 98.5 F (36.9 C)  TempSrc:  Axillary Axillary Axillary  SpO2: 96% 98% 98% 95%  Weight:   190 lb 9.6 oz (86.5 kg)     Intake/Output Summary (Last 24 hours) at 10/10/2017 0939 Last data filed at 10/10/2017 0450 Gross per 24 hour  Intake 769.63 ml  Output 1000 ml  Net -230.37 ml   Filed Weights   10/09/17 0008 10/09/17 0256 10/10/17 0435  Weight: 185 lb 13.6 oz (84.3 kg) 185 lb 13.6 oz (84.3 kg) 190 lb 9.6 oz (86.5 kg)    Telemetry    No VT - Personally Reviewed  ECG    Accentuation of ST segment anterior leads - Personally Reviewed  Physical Exam   GEN: No acute distress.  Sleepy Neck: No JVD Cardiac: RRR, no murmurs, rubs, or gallops.  Respiratory: Clear to auscultation bilaterally. GI: Soft, nontender, non-distended  MS: No edema; No  deformity. Neuro:  Nonfocal  Psych: prior agitated  Labs    Chemistry Recent Labs  Lab 10/08/17 1439 10/09/17 0847  NA 143 137  K 2.7* 2.7*  CL 105 102  CO2 23 23  GLUCOSE 97 88  BUN 10 14  CREATININE 0.78 0.67  CALCIUM 8.7* 8.0*  PROT 6.1*  --   ALBUMIN 3.2*  --   AST 52*  --   ALT 28  --   ALKPHOS 71  --   BILITOT 1.1  --   GFRNONAA >60 >60  GFRAA >60 >60  ANIONGAP 15 12     Hematology Recent Labs  Lab 10/08/17 1439 10/09/17 0607  WBC 14.0* 16.8*  RBC 4.56 4.27  HGB 14.3 13.0  HCT 43.4 40.7  MCV 95.2 95.3  MCH 31.4 30.4  MCHC 32.9 31.9  RDW 14.2 14.5  PLT 214 196    Cardiac Enzymes Recent Labs  Lab 10/09/17 0607 10/09/17 0847  TROPONINI 11.79* 12.99*    Recent Labs  Lab 10/08/17 1443 10/08/17 1744  TROPIPOC 4.22* 7.28*     BNP Recent Labs  Lab 10/09/17 0607  BNP 1,779.5*     DDimer No results for input(s): DDIMER in the last 168 hours.   Radiology  Ct Abdomen Pelvis W Contrast  Result Date: 10/08/2017 CLINICAL DATA:  Nausea and vomiting.  Generalized weakness. EXAM: CT ABDOMEN AND PELVIS WITH CONTRAST TECHNIQUE: Multidetector CT imaging of the abdomen and pelvis was performed using the standard protocol following bolus administration of intravenous contrast. CONTRAST:  ISOVUE-300 IOPAMIDOL (ISOVUE-300) INJECTION 61% COMPARISON:  Abdominal radiographs from earlier today. FINDINGS: Lower chest: Scattered parenchymal bands at both lung bases compatible with mild scarring or atelectasis. Hepatobiliary: Diffuse hepatic steatosis. No definite liver surface irregularity. No liver mass. Small venovenous shunt in the inferior segment 3 left liver lobe (series 8/image 10). Normal gallbladder with no radiopaque cholelithiasis. No biliary ductal dilatation. Pancreas: Normal, with no mass or duct dilation. Spleen: Normal size. No mass. Adrenals/Urinary Tract: Normal adrenals. Hypodense subcentimeter interpolar left renal cortical focus is too small  to characterize and requires no follow-up. Otherwise normal kidneys, with no hydronephrosis. Normal bladder. Stomach/Bowel: Normal non-distended stomach. There is a small infraumbilical ventral hernia to the right of midline containing a tiny portion of a mildly thick walled small bowel loop (series 7/image 91 and series 6/images 33-37), proximal to which there a few mildly dilated small bowel loops with air-fluid levels measuring up to 4.0 cm diameter, and proximal to which the small bowel is relatively decompressed. No additional sites of small bowel wall thickening. Appendix not discretely visualized. No pericecal inflammatory changes. Postsurgical changes from partial distal colectomy with intact appearing distal colonic anastomosis. Mild diverticulosis in the remnant sigmoid colon. No large bowel wall thickening or significant pericolonic fat stranding. Vascular/Lymphatic: Atherosclerotic nonaneurysmal abdominal aorta. Patent portal, splenic, hepatic and renal veins. No pathologically enlarged lymph nodes in the abdomen or pelvis. Reproductive: Status post hysterectomy, with no abnormal findings at the vaginal cuff. No adnexal mass. Other: No pneumoperitoneum, ascites or focal fluid collection. Small fat containing periumbilical hernia. Moderate fat containing midline supraumbilical ventral abdominal hernia (series 3/image 35). Musculoskeletal: No aggressive appearing focal osseous lesions. Marked thoracolumbar spondylosis. IMPRESSION: 1. Mechanical small-bowel obstruction in the mid small bowel at the level of a small infraumbilical ventral abdominal hernia to the right of midline, which contains a tiny portion of a mildly thick walled small bowel loop. Early ischemic changes cannot be excluded in this partially herniated small bowel loop. Surgical consultation advised. 2. No pneumatosis, free air or abscess. 3. Postsurgical changes from partial distal colectomy. Mild diverticulosis in the remnant sigmoid  colon. 4. Chronic findings include: Aortic Atherosclerosis (ICD10-I70.0). Diffuse hepatic steatosis. Additional small fat containing periumbilical hernia and moderate fat containing midline supraumbilical ventral abdominal hernia. Electronically Signed   By: Delbert Phenix M.D.   On: 10/08/2017 18:32   Dg Chest Port 1 View  Result Date: 10/09/2017 CLINICAL DATA:  Dyspnea. EXAM: PORTABLE CHEST 1 VIEW COMPARISON:  Radiograph of October 08, 2017. FINDINGS: Stable cardiomediastinal silhouette. Atherosclerosis of thoracic aorta is noted. Hypoinflation of the lungs is noted with mild bibasilar subsegmental atelectasis. Increased bilateral perihilar densities are noted which may represent edema or crowding of vascular structures due to hypoinflation. No pneumothorax or pleural effusion is noted. Bony thorax is unremarkable. IMPRESSION: Hypoinflation of the lungs is noted with mild bibasilar subsegmental atelectasis. Increased bilateral perihilar densities are noted which may represent mild edema or possibly crowding of vascular structures due to hypoinflation. Aortic Atherosclerosis (ICD10-I70.0). Electronically Signed   By: Lupita Raider, M.D.   On: 10/09/2017 14:18   Dg Chest Portable 1 View  Result Date: 10/08/2017 CLINICAL DATA:  Nausea and vomiting today. EXAM: PORTABLE CHEST 1  VIEW COMPARISON:  PA and lateral chest 04/13/2010.  CT chest 04/14/2010. FINDINGS: Heart size is upper normal. There is aortic atherosclerosis. Lungs are clear. No pneumothorax or pleural effusion. No acute bony abnormality. IMPRESSION: No acute disease. Electronically Signed   By: Drusilla Kannerhomas  Dalessio M.D.   On: 10/08/2017 15:28   Dg Abd Portable 2 Views  Result Date: 10/08/2017 CLINICAL DATA:  Nausea and vomiting today. EXAM: PORTABLE ABDOMEN - 2 VIEW COMPARISON:  None. FINDINGS: The bowel gas pattern is normal. There is no evidence of free air. No radio-opaque calculi or other significant radiographic abnormality is seen. IMPRESSION:  Negative exam. Electronically Signed   By: Drusilla Kannerhomas  Dalessio M.D.   On: 10/08/2017 15:27    Cardiac Studies   ECHO 10/09/17: - Left ventricle: The cavity size was normal. Wall thickness was   increased in a pattern of mild LVH. Systolic function was   moderately reduced. The estimated ejection fraction was in the   range of 35% to 40%. There is akinesis of the apical myocardium.   Doppler parameters are consistent with abnormal left ventricular   relaxation (grade 1 diastolic dysfunction). - Aortic valve: There was mild regurgitation. - Mitral valve: Calcified annulus. - Pulmonary arteries: Systolic pressure was mildly increased. PA   peak pressure: 35 mm Hg (S).  Impressions:  - Large area of apical akinesis with overall moderate LV   dysfunction; mild diastolic dysfunction; cannot R/O apical   thrombus (suggest FU limited study with definity to further   assess); mild LVH; mild AI; mild TR with mildly elevated   pulmonary pressure.  Patient Profile     84 y.o. female with apical MI (likely LAD), ischemic cardiomyopathy, ETOH, dementia SBO  Assessment & Plan    MI (Trop 13)  - Continue heparin IV until tomorrow morning (will be >48 then)  - ASA PR  - Metop IV   - Will give PO meds when able. Spoke to Dr. Cliffton AstersWhite. Should be ok to give ice chips and PO meds. Right now she is quite lethargic (CIWA).   ETOH/Dementia (1.5 L wine a day)  - CIWA  Dilated cardiomyopathy  - Will add PO meds when able. No evidence of hypoxia. Lungs clear.   - EF 35%  Hypokalemia  - replete now 4.2  SBO  - spoke to Dr. Cliffton AstersWhite. Non surgical. Improving. OK for gentle PO  For questions or updates, please contact [facility] Please consult [email or web address] for contact info under Cardiology/STEMI.      Signed, Donato SchultzMark Glen Kesinger, MD  10/10/2017, 9:39 AM

## 2017-10-10 NOTE — Progress Notes (Signed)
ANTICOAGULATION CONSULT NOTE  Pharmacy Consult for Heparin Indication: chest pain/ACS  Allergies  Allergen Reactions  . Mevacor [Lovastatin]     Leg cramps  . Olanzapine     Ringing in the ears  . Paxil [Paroxetine Hcl]     nausea  . Namenda [Memantine Hcl] Other (See Comments)    headache  . Toprol Xl [Metoprolol Succinate] Palpitations    Patient Measurements: Ideal Body Weight: 55 kg  Heparin Dosing Weight: 74 kg   Vital Signs: Temp: 98.5 F (36.9 C) (03/07 0756) Temp Source: Axillary (03/07 0756) BP: 122/67 (03/07 0756) Pulse Rate: 87 (03/07 0756)  Labs: Recent Labs    10/08/17 1439 10/09/17 0607 10/09/17 0847 10/10/17 0728  HGB 14.3 13.0  --   --   HCT 43.4 40.7  --   --   PLT 214 196  --   --   APTT  --  92*  --   --   LABPROT  --  14.7  --   --   INR  --  1.15  --   --   HEPARINUNFRC  --  0.38  --  0.10*  CREATININE 0.78  --  0.67  --   TROPONINI  --  11.79* 12.99*  --     CrCl cannot be calculated (Unknown ideal weight.).   Medical History: Past Medical History:  Diagnosis Date  . Dementia   . Depression   . Dyslipidemia   . Headache(784.0)   . Hypercholesteremia   . Hypertension   . Memory deficits 05/11/2015  . Migraine   . Normal nuclear stress test 2005  . Palpitation     Medications:  Medications Prior to Admission  Medication Sig Dispense Refill Last Dose  . amLODipine (NORVASC) 10 MG tablet TAKE 1 TABLET BY MOUTH EVERY DAY (Patient taking differently: TAKE 10mg  BY MOUTH EVERy evening) 90 tablet 0 10/06/2017  . Aspirin-Salicylamide-Caffeine (BC HEADACHE POWDER PO) Take 1 packet by mouth 2 (two) times daily as needed (pain).   Past Week at Unknown time  . atorvastatin (LIPITOR) 20 MG tablet TAKE 1/2 TABLET BY MOUTH EVERY DAY (Patient taking differently: TAKE 10 MG BY MOUTH TWICE DAILY) 15 tablet 5 10/06/2017  . BENICAR HCT 40-12.5 MG tablet TAKE 1 TABLET BY MOUTH EVERY DAY 90 tablet 0 10/08/2017 at 1030  . carvedilol (COREG) 25 MG tablet  Take 25 mg by mouth every evening.   10/06/2017  . diclofenac sodium (VOLTAREN) 1 % GEL Apply 4 g topically 4 (four) times daily as needed for pain.   Past Week at Unknown time  . dimenhyDRINATE (DRAMAMINE) 50 MG tablet Take 50 mg by mouth as needed for nausea.   10/08/2017 at Unknown time  . diphenhydramine-acetaminophen (TYLENOL PM) 25-500 MG TABS tablet Take 1 tablet by mouth at bedtime.   10/07/2017 at Unknown time  . donepezil (ARICEPT) 10 MG tablet Take 1 tablet (10 mg total) by mouth at bedtime. 30 tablet 5 10/06/2017  . memantine (NAMENDA) 5 MG tablet 5mg  in the am for 2 weeks then increase to 5mg  in the am and 5 mg in the pm 60 tablet 6 10/06/2017  . potassium chloride SA (K-DUR,KLOR-CON) 20 MEQ tablet TAKE 1 TABLET BY MOUTH EVERY DAY (Patient taking differently: TAKE TABLET BY MOUTH twice) 30 tablet 1 10/06/2017  . sertraline (ZOLOFT) 50 MG tablet TAKE 3 TABLETS(150 MG) BY MOUTH DAILY 90 tablet 0 10/06/2017  . carvedilol (COREG) 12.5 MG tablet Take 1 tablet (12.5 mg total)  by mouth every morning. (Patient not taking: Reported on 10/08/2017) 30 tablet 0 Not Taking at Unknown time    Assessment: [age] YOF here with vomiting, diaphoresis and generalized weakness. Patient's troponin continues to trend up. Pharmacy consulted to start IV heparin for ACS. She is not on any anticoagulation at home. Heparin level now subtherapeutic at 0.1 after previously therapeutic on same rate. Per discussion with RN, no issues with drip or IV line noted and no bleeding issues. H/H and Plt wnl. No intervention per Cardiology and will continue heparin x 48 hours.  Goal of Therapy:  Heparin level 0.3-0.7 units/ml Monitor platelets by anticoagulation protocol: Yes   Plan:  -Increase IV heparin to 1100 units/hr -8h heparin level -Monitor daily CBC, heparin level, and s/s of bleeding -F/u LOT - 48 hours?  Babs BertinHaley Loistine Eberlin, PharmD, BCPS Clinical Pharmacist Clinical phone for 10/10/2017 until 3:30pm: [phone] If after 3:30pm,  please call main pharmacy at: [phone] 10/10/2017 8:30 AM

## 2017-10-10 NOTE — Progress Notes (Signed)
Consultation Note Date: 10/10/2017   Patient Name: Mary Harrison  DOB: March 15, 1931  MRN: [id number]  Age / Sex: 84 y.o., female  PCP: Daphene Calamity, MD Referring Physician: Janece Canterbury, MD  Reason for Consultation: Establishing goals of care  HPI/Patient Profile: 84 y.o. female  with past medical history of dementia, HTN, hyperlipidemia, depression admitted on 10/08/2017 with nausea, vomiting, abdominal pain, and chest pain. Workup revealed initially NSTEMI, UTI, and SBO. During admission EKG indicated STEMI, troponins continued to trend up. ECHO shows HF with EF 35-40%- with large area of akinesis. She was started on medical management for STEMI (not candidate for invasive interventions) and bowel rest for SBO. CIWA protocol also initiated for history of daily 1.5L alcohol intake. Patient has remained confused and agitated at times during admission. SBO is resolving- diet advancing. Palliative medicine consulted for Mary Harrison.   Clinical Assessment and Goals of Care:  I have reviewed medical records including EPIC notes, labs and imaging, assessed the patient and then met separately with patient's available children - Freda Munro and Jenny Reichmann  to discuss diagnosis prognosis, GOC, EOL wishes, disposition and options.  Patient was not able to participate in [place] due to dementia.  I introduced Palliative Medicine as specialized medical care for people living with serious illness. It focuses on providing relief from the symptoms and stress of a serious illness. The goal is to improve quality of life for both the patient and the family.  We discussed a brief life review of the patient. 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 Her husband died 3 years ago of a sudden MI.   As far as functional and nutritional status- she has been living in the home with assistance of part time caregivers. She is incontinent of urine. Able to dress  herself. Needs assistance with bathing and preparing meals. There has been some decline noted over the last few months. Her daughters have noted she lives a mostly bed to chair existence. She sleeps a lot during the day. She continues to eat without difficulty- no evidence of losing weight.     We discussed their current illness and what it means in the larger context of their on-going co-morbidities.  Natural disease trajectory and expectations at EOL were discussed. Reviewed patient's cardiac status and high risk for mortality and sudden cardiac death d/t no surgical interventions for MI. Discussed heart failure and dementia trajectories. Daughters note that she does not like being in the hospital. She would not want to die in a hospital or in a nursing home. They state if their mother's life expectancy was limited the main [place] would be to take her home and focus on making her comfortable and being in her home for what time of life she has left.  The difference between aggressive medical intervention and comfort care was explained considered in light of the patient's goals of care.   Advanced directives, concepts specific to code status, artifical feeding and hydration, and rehospitalization were considered and discussed. Patient's family agreed that DNR status  best aligns with GOC- they understand this to mean that if patient has no pulse and is not breathing that efforts will not be made to resuscitate her including CPR and intubation. They further would like to transition their mother's care to focus on her comfort and getting her home as soon as possible. They would like to stop drawing labs, stop medications that are requiring her to be hospitalized, stop IV fluids and give only medications that will provide her comfort and improve quality of life. They do want to attempt to advance her diet before she is discharged home as she is currently NPO. They would not want her artificially fed- understanding  that to cease intake is part of the natural dying process.  Hospice and Palliative Care services outpatient were explained and offered. They would like to take patient home with support of Hospice services. They are also going to arrange additional in home care.  Questions and concerns were addressed.  The family was encouraged to call with questions or concerns.   Primary Decision Maker NEXT OF KIN- patient's daughters   SUMMARY OF RECOMMENDATIONS -De-escalate cardiac care to comfort only no further labs, IV medications, etc, d/c telemetry- GOC is to d/c home with Hospice -Advance diet per surgery and d/c home with Hospice -DNR -Care manager referral for Hospice choice -Will resume home Zoloft 55m daily to prevent SSRI withdrawal as patient cleared for po meds -Morphine SL  562mq2hr prn SOB or pain -Morphine IV 3m33m2hr prn SOB -Delirium precautions     Code Status/Advance Care Planning:  DNR  Palliative Prophylaxis:   Delirium Protocol and Frequent Pain Assessment  Prognosis:    < 6 weeks d/t STEMI with no surgical intervention, transition to comfort care, patient high risk for sudden cardiac death, new CHF, plan for home with Hospice   Discharge Planning: Home with Hospice  Primary Diagnoses: Present on Admission: . Hypertension . Hypercholesteremia . Dementia . NSTEMI (non-ST elevated myocardial infarction) ([facility] SBO (small bowel obstruction) ([facility] Sepsis ([facility] Hypokalemia . Alcohol use . UTI (urinary tract infection)   I have reviewed the medical record, interviewed the patient and family, and examined the patient. The following aspects are pertinent.  Past Medical History:  Diagnosis Date  . Dementia   . Depression   . Dyslipidemia   . Headache(784.0)   . Hypercholesteremia   . Hypertension   . Memory deficits 05/11/2015  . Migraine   . Normal nuclear stress test 2005  . Palpitation    Social History   Socioeconomic History  . Marital  status: Married    Spouse name: None  . Number of children: 3  . Years of education: 12 97 Highest education level: None  Social Needs  . Financial resource strain: None  . Food insecurity - worry: None  . Food insecurity - inability: None  . Transportation needs - medical: None  . Transportation needs - non-medical: None  Occupational History  . Occupation: retired  Tobacco Use  . Smoking status: Former Smoker    Last attempt to quit: 08/06/1968    Years since quitting: 49.2  . Smokeless tobacco: Never Used  Substance and Sexual Activity  . Alcohol use: Yes    Alcohol/week: 0.6 oz    Types: 1 Glasses of wine per week  . Drug use: No  . Sexual activity: None  Other Topics Concern  . None  Social History Narrative   Lives at home alone.   Patient  drinks about 1 cup of caffeine daily.   Patient is right handed.    Family History  Problem Relation Age of Onset  . Parkinsonism Mother   . Migraines Mother   . Heart disease Father   . Alcohol abuse Brother   . Alcohol abuse Brother   . Alcohol abuse Brother   . Migraines Sister   . Seizures Sister    Scheduled Meds: . LORazepam  0.5 mg Intravenous Once  . metoprolol tartrate  5 mg Intravenous Q6H   Continuous Infusions: . cefTRIAXone (ROCEPHIN)  IV Stopped (10/10/17 0046)  . dextrose 50 mL/hr at 10/09/17 2035  . sodium chloride     PRN Meds:.acetaminophen, glycopyrrolate **OR** glycopyrrolate **OR** glycopyrrolate, haloperidol **OR** haloperidol **OR** haloperidol lactate, LORazepam, morphine injection, morphine CONCENTRATE **OR** morphine CONCENTRATE, nitroGLYCERIN, ondansetron (ZOFRAN) IV Medications Prior to Admission:  Prior to Admission medications   Medication Sig Start Date End Date Taking? Authorizing Provider  amLODipine (NORVASC) 10 MG tablet TAKE 1 TABLET BY MOUTH EVERY DAY Patient taking differently: TAKE 70m BY MOUTH EVERy evening 08/09/15  Yes BDarlin Coco MD  Aspirin-Salicylamide-Caffeine (BC  HEADACHE POWDER PO) Take 1 packet by mouth 2 (two) times daily as needed (pain).   Yes [provider]  atorvastatin (LIPITOR) 20 MG tablet TAKE 1/2 TABLET BY MOUTH EVERY DAY Patient taking differently: TAKE 10 MG BY MOUTH TWICE DAILY 04/18/15  Yes BDarlin Coco MD  BENICAR HCT 40-12.5 MG tablet TAKE 1 TABLET BY MOUTH EVERY DAY 08/31/15  Yes BDarlin Coco MD  carvedilol (COREG) 25 MG tablet Take 25 mg by mouth every evening.   Yes [provider]  diclofenac sodium (VOLTAREN) 1 % GEL Apply 4 g topically 4 (four) times daily as needed for pain. 12/11/16  Yes [provider]  dimenhyDRINATE (DRAMAMINE) 50 MG tablet Take 50 mg by mouth as needed for nausea.   Yes [provider]  diphenhydramine-acetaminophen (TYLENOL PM) 25-500 MG TABS tablet Take 1 tablet by mouth at bedtime.   Yes [provider]  donepezil (ARICEPT) 10 MG tablet Take 1 tablet (10 mg total) by mouth at bedtime. 12/17/16  Yes WKathrynn Ducking MD  memantine ([facility] 5 MG tablet 539min the am for 2 weeks then increase to 34m23mn the am and 5 mg in the pm 10/24/16  Yes MarDennie BibleP  potassium chloride SA (K-DUR,KLOR-CON) 20 MEQ tablet TAKE 1 TABLET BY MOUTH EVERY DAY Patient taking differently: TAKE 67m63mABLET BY MOUTH twice 07/15/15  Yes BracDarlin Coco  sertraline (ZOLOFT) 50 MG tablet TAKE 3 TABLETS(150 MG) BY MOUTH DAILY 08/12/17  Yes WillKathrynn Ducking  carvedilol (COREG) 12.5 MG tablet Take 1 tablet (12.5 mg total) by mouth every morning. Patient not taking: Reported on 10/08/2017 03/16/16   AlleLacretia Leigh   Allergies  Allergen Reactions  . Mevacor [Lovastatin]     Leg cramps  . Olanzapine     Ringing in the ears  . Paxil [Paroxetine Hcl]     nausea  . Namenda [Memantine Hcl] Other (See Comments)    headache  . Toprol Xl [Metoprolol Succinate] Palpitations   Review of Systems  Unable to perform ROS: Dementia    Physical Exam  Constitutional:  She appears well-developed and well-nourished.  Cardiovascular:  irregular  Pulmonary/Chest: Effort normal. She has wheezes.  Abdominal: Soft. Bowel sounds are normal.  Neurological:  Lethargic, confused  Nursing note and vitals reviewed.   Vital Signs: BP 133/68   Pulse  87   Temp 99.1 F (37.3 C) (Axillary)   Resp 19   Wt 86.5 kg (190 lb 9.6 oz)   SpO2 93%   BMI 32.72 kg/m  Pain Assessment: No/denies pain POSS *See Group Information*: 2-Acceptable,Slightly drowsy, easily aroused Pain Score: Asleep   SpO2: SpO2: 93 % O2 Device:SpO2: 93 % O2 Flow Rate: .O2 Flow Rate (L/min): 4 L/min  IO: Intake/output summary:   Intake/Output Summary (Last 24 hours) at 10/10/2017 1625 Last data filed at 10/10/2017 1000 Gross per 24 hour  Intake 628.63 ml  Output 1000 ml  Net -371.37 ml    LBM: Last BM Date: 10/08/17 Baseline Weight: Weight: 84.3 kg (185 lb 13.6 oz) Most recent weight: Weight: 86.5 kg (190 lb 9.6 oz)     Palliative Assessment/Data: PPS: 20%     Thank you for this consult. Palliative medicine will continue to follow and assist as needed.   Time In: 1300 Time Out: 1530 Time Total: 150 mins Prolonged services billed: Yes Greater than 50%  of this time was spent counseling and coordinating care related to the above assessment and plan.  Signed by: Mariana Kaufman, AGNP-C Palliative Medicine    Please contact Palliative Medicine Team phone at [phone] for questions and concerns.  For individual provider: See Shea Evans

## 2017-10-10 NOTE — Progress Notes (Signed)
PROGRESS NOTE  Mary Harrison  CBS:[id number] DOB: Dec 05, 1930 DOA: 10/08/2017 PCP: Daphene Calamity, MD  Brief Narrative:   The patient is an 84 year old female with dementia, hypertension, hyperlipidemia, migraine headaches, depression, alcohol use who presented with nausea, vomiting, abdominal and chest pains.  She was found to have an NSTEMI initially and CT of abdomen and pelvis demonstrated a small bowel obstruction.  General surgery and cardiology were consulted.  The family elected medical management of her heart attack and declined heart catheterization.  They met with palliative care on 3/7 and discussed her complicated case with her bowel obstruction and her heart attack and dilated cardiomyopathy.  They have elected for comfort measures and would like her to go home with hospice as soon as possible.  We are gradually advancing her diet and we are emphasizing comfort with her medications.  Assessment & Plan:  STEMI apparent on EKG on 3/6, Troponin 12.99 on 3/6, medical management - Appreciate cardiology assistance  - d/c rectal aspirin, heparin, IV metoprolol  - ECHO: Moderate LV dysfunction, ejection fraction 35-40% with akinesis of the apical myocardium, grade 1 diastolic dysfunction, no prior for comparison.  No evidence of apical thrombus on repeat echocardiogram  Hypokalemia: Resolved with IV potassium repletion.  No further blood work  Wheezing, likely due to acute systolic heart failure from MI and IVF -  CXR:  Vascular congestion -  No lasix for now  Small bowel obstruction with SIRS criteria, Pt is poor candidate for surgery.  Has bowel sounds.  No BM since admission  - appreciate General Surgery assistance -Advance to clear liquid diet  Dementia: On Namenda.Previously quite agitated. Settled with benzodiazepines. - holding oral namenda and donepezil  Alcohol use: 1.5 L of wine daily. -PRN Ativan  HTN/HLD:  BP stable, focusing on symptom  management  Possible UTI, urine culture not obtained -  Ceftriaxone day 2 of 3  Depression: Stable, no suicidal or homicidal ideations. -  Resume zoloft   DVT prophylaxis:  Comfort measures/hospice Code Status:  DNR Family Communication: Patient alone today.  Palliative care met with the family today. Disposition Plan: Home in a day or 2 with home hospice   Consultants:   General surgery  [facility]  Procedures:  None  Antimicrobials:  Anti-infectives (From admission, onward)   Start     Dose/Rate Route Frequency Ordered Stop   10/09/17 0000  cefTRIAXone (ROCEPHIN) 1 g in sodium chloride 0.9 % 100 mL IVPB     1 g 200 mL/hr over 30 Minutes Intravenous Every 24 hours 10/08/17 2338         Subjective:  Patient states she just wants to be left alone.  She denies any acute pains other than in her legs people touch them.  She denies chest pains, shortness of breath, abdominal pain.  Denies bowel movements or flatus since admission  Objective: Vitals:   10/10/17 1000 10/10/17 1142 10/10/17 1250 10/10/17 1627  BP: 124/69 135/69 130/71 133/68  Pulse: 92 91 82 86  Resp:  _0 Temp:  99.1 F (37.3 C)  97.8 F (36.6 C)  TempSrc:  Axillary  Oral  SpO2:  93%  95%  Weight:        Intake/Output Summary (Last 24 hours) at 10/10/2017 1702 Last data filed at 10/10/2017 1000 Gross per 24 hour  Intake 628.63 ml  Output 1000 ml  Net -371.37 ml   Filed Weights   10/09/17 0008 10/09/17 0256 10/10/17 0435  Weight:  84.3 kg (185 lb 13.6 oz) 84.3 kg (185 lb 13.6 oz) 86.5 kg (190 lb 9.6 oz)    Examination:  General exam:  Adult female.  Was able to be assisted from the bedside commode to the bed with minimal assistance from tech HEENT:  NCAT, MMM Respiratory system: Faint wheeze, no focal rales or rhonchi Cardiovascular system: Regular rate and rhythm, normal S1/S2. No murmurs, rubs, gallops or clicks.  Warm extremities Gastrointestinal system: Normal active bowel  sounds, soft, nondistended, nontender. MSK:  Normal tone and bulk, no lower extremity edema Neuro:  Grossly intact   Data Reviewed: I have personally reviewed following labs and imaging studies  CBC: Recent Labs  Lab 10/08/17 1439 10/09/17 0607 10/10/17 0728  WBC 14.0* 16.8* 11.5*  NEUTROABS 13.4*  --   --   HGB 14.3 13.0 14.0  HCT 43.4 40.7 44.4  MCV 95.2 95.3 96.7  PLT 214 196 010   Basic Metabolic Panel: Recent Labs  Lab 10/08/17 1439 10/08/17 1906 10/09/17 0607 10/09/17 0847 10/10/17 0728  NA 143  --   --  137 136  K 2.7*  --   --  2.7* 4.2  CL 105  --   --  102 103  CO2 23  --   --  23 17*  GLUCOSE 97  --   --  88 105*  BUN 10  --   --  14 12  CREATININE 0.78  --   --  0.67 0.59  CALCIUM 8.7*  --   --  8.0* 8.5*  MG  --  1.7 2.3  --   --    GFR: CrCl cannot be calculated (Unknown ideal weight.). Liver Function Tests: Recent Labs  Lab 10/08/17 1439  AST 52*  ALT 28  ALKPHOS 71  BILITOT 1.1  PROT 6.1*  ALBUMIN 3.2*   Recent Labs  Lab 10/08/17 1439  LIPASE 26   No results for input(s): AMMONIA in the last 168 hours. Coagulation Profile: Recent Labs  Lab 10/09/17 0607  INR 1.15   Cardiac Enzymes: Recent Labs  Lab 10/09/17 0607 10/09/17 0847  TROPONINI 11.79* 12.99*   BNP (last 3 results) No results for input(s): PROBNP in the last 8760 hours. HbA1C: Recent Labs    10/09/17 0607  HGBA1C 5.1   CBG: Recent Labs  Lab 10/10/17 0127 10/10/17 0546 10/10/17 0752 10/10/17 1140 10/10/17 1625  GLUCAP 138* 113* 108* 129* 119*   Lipid Profile: Recent Labs    10/09/17 0607  CHOL 150  HDL 86  LDLCALC 49  TRIG 73  CHOLHDL 1.7   Thyroid Function Tests: No results for input(s): TSH, T4TOTAL, FREET4, T3FREE, THYROIDAB in the last 72 hours. Anemia Panel: No results for input(s): VITAMINB12, FOLATE, FERRITIN, TIBC, IRON, RETICCTPCT in the last 72 hours. Urine analysis:    Component Value Date/Time   COLORURINE YELLOW 10/08/2017 2159    APPEARANCEUR HAZY (A) 10/08/2017 2159   LABSPEC >1.046 (H) 10/08/2017 2159   PHURINE 6.0 10/08/2017 2159   GLUCOSEU NEGATIVE 10/08/2017 2159   HGBUR SMALL (A) 10/08/2017 2159   BILIRUBINUR NEGATIVE 10/08/2017 2159   KETONESUR 5 (A) 10/08/2017 2159   PROTEINUR NEGATIVE 10/08/2017 2159   NITRITE NEGATIVE 10/08/2017 2159   LEUKOCYTESUR LARGE (A) 10/08/2017 2159   Sepsis Labs: _0 (procalcitonin:4,lacticidven:4)  ) Recent Results (from the past 240 hour(s))  MRSA PCR Screening     Status: None   Collection Time: 10/09/17  2:47 AM  Result Value Ref Range Status   MRSA  by PCR NEGATIVE NEGATIVE Final    Comment:        The GeneXpert MRSA Assay (FDA approved for NASAL specimens only), is one component of a comprehensive MRSA colonization surveillance program. It is not intended to diagnose MRSA infection nor to guide or monitor treatment for MRSA infections. Performed at [facility], [place] [address], [place], [place] [address]   Culture, blood (Routine X 2) w Reflex to ID Panel     Status: None (Preliminary result)   Collection Time: 10/09/17  5:45 AM  Result Value Ref Range Status   Specimen Description BLOOD LEFT HAND  Final   Special Requests   Final    BOTTLES DRAWN AEROBIC AND ANAEROBIC Blood Culture adequate volume   Culture   Final    NO GROWTH 1 DAY Performed at [facility], [place] [address], [place], [place] [address]    Report Status PENDING  Incomplete  Culture, blood (Routine X 2) w Reflex to ID Panel     Status: None (Preliminary result)   Collection Time: 10/09/17  6:00 AM  Result Value Ref Range Status   Specimen Description BLOOD RIGHT HAND  Final   Special Requests   Final    BOTTLES DRAWN AEROBIC AND ANAEROBIC Blood Culture adequate volume   Culture   Final    NO GROWTH 1 DAY Performed at [facility], [place] [address], [place], [place] [address]    Report Status PENDING  Incomplete      Radiology Studies: Ct Abdomen Pelvis W  Contrast  Result Date: 10/08/2017 CLINICAL DATA:  Nausea and vomiting.  Generalized weakness. EXAM: CT ABDOMEN AND PELVIS WITH CONTRAST TECHNIQUE: Multidetector CT imaging of the abdomen and pelvis was performed using the standard protocol following bolus administration of intravenous contrast. CONTRAST:  1108m ISOVUE-300 IOPAMIDOL (ISOVUE-300) INJECTION 61% COMPARISON:  Abdominal radiographs from earlier today. FINDINGS: Lower chest: Scattered parenchymal bands at both lung bases compatible with mild scarring or atelectasis. Hepatobiliary: Diffuse hepatic steatosis. No definite liver surface irregularity. No liver mass. Small venovenous shunt in the inferior segment 3 left liver lobe (series 8/image 10). Normal gallbladder with no radiopaque cholelithiasis. No biliary ductal dilatation. Pancreas: Normal, with no mass or duct dilation. Spleen: Normal size. No mass. Adrenals/Urinary Tract: Normal adrenals. Hypodense subcentimeter interpolar left renal cortical focus is too small to characterize and requires no follow-up. Otherwise normal kidneys, with no hydronephrosis. Normal bladder. Stomach/Bowel: Normal non-distended stomach. There is a small infraumbilical ventral hernia to the right of midline containing a tiny portion of a mildly thick walled small bowel loop (series 7/image 91 and series 6/images 33-37), proximal to which there a few mildly dilated small bowel loops with air-fluid levels measuring up to 4.0 cm diameter, and proximal to which the small bowel is relatively decompressed. No additional sites of small bowel wall thickening. Appendix not discretely visualized. No pericecal inflammatory changes. Postsurgical changes from partial distal colectomy with intact appearing distal colonic anastomosis. Mild diverticulosis in the remnant sigmoid colon. No large bowel wall thickening or significant pericolonic fat stranding. Vascular/Lymphatic: Atherosclerotic nonaneurysmal abdominal aorta. Patent portal,  splenic, hepatic and renal veins. No pathologically enlarged lymph nodes in the abdomen or pelvis. Reproductive: Status post hysterectomy, with no abnormal findings at the vaginal cuff. No adnexal mass. Other: No pneumoperitoneum, ascites or focal fluid collection. Small fat containing periumbilical hernia. Moderate fat containing midline supraumbilical ventral abdominal hernia (series 3/image 35). Musculoskeletal: No aggressive appearing focal osseous lesions. Marked thoracolumbar  spondylosis. IMPRESSION: 1. Mechanical small-bowel obstruction in the mid small bowel at the level of a small infraumbilical ventral abdominal hernia to the right of midline, which contains a tiny portion of a mildly thick walled small bowel loop. Early ischemic changes cannot be excluded in this partially herniated small bowel loop. Surgical consultation advised. 2. No pneumatosis, free air or abscess. 3. Postsurgical changes from partial distal colectomy. Mild diverticulosis in the remnant sigmoid colon. 4. Chronic findings include: Aortic Atherosclerosis (ICD10-I70.0). Diffuse hepatic steatosis. Additional small fat containing periumbilical hernia and moderate fat containing midline supraumbilical ventral abdominal hernia. Electronically Signed   By: Ilona Sorrel M.D.   On: 10/08/2017 18:32   Dg Chest Port 1 View  Result Date: 10/09/2017 CLINICAL DATA:  Dyspnea. EXAM: PORTABLE CHEST 1 VIEW COMPARISON:  Radiograph of October 08, 2017. FINDINGS: Stable cardiomediastinal silhouette. Atherosclerosis of thoracic aorta is noted. Hypoinflation of the lungs is noted with mild bibasilar subsegmental atelectasis. Increased bilateral perihilar densities are noted which may represent edema or crowding of vascular structures due to hypoinflation. No pneumothorax or pleural effusion is noted. Bony thorax is unremarkable. IMPRESSION: Hypoinflation of the lungs is noted with mild bibasilar subsegmental atelectasis. Increased bilateral perihilar  densities are noted which may represent mild edema or possibly crowding of vascular structures due to hypoinflation. Aortic Atherosclerosis (ICD10-I70.0). Electronically Signed   By: Marijo Conception, M.D.   On: 10/09/2017 14:18     Scheduled Meds: . LORazepam  0.5 mg Intravenous Once  . metoprolol tartrate  5 mg Intravenous Q6H  . [START ON 10/11/2017] sertraline  50 mg Oral Daily   Continuous Infusions: . cefTRIAXone (ROCEPHIN)  IV Stopped (10/10/17 0046)  . dextrose 50 mL/hr at 10/09/17 2035  . sodium chloride       LOS: 2 days    Time spent: 30 min    Janece Canterbury, MD [facility] Pager [phone]  If 7PM-7AM, please contact night-coverage [email or web address] Password [email or web address] 10/10/2017, 5:02 PM

## 2017-10-10 NOTE — Progress Notes (Signed)
*  PRELIMINARY RESULTS* Echocardiogram 2D Echocardiogram has been performed.  Celene SkeenVijay  Ac Colan 10/10/2017, 3:01 PM

## 2017-10-10 NOTE — Progress Notes (Signed)
[place] paged the following:  "[id number]:[place],B:Patient is now comfort care, Admit initially for SOB, stemi, SBO.Pt.and family requesting Tylenol PM for sleep before using CC meds."

## 2017-10-11 LAB — GLUCOSE, CAPILLARY
GLUCOSE-CAPILLARY: 146 mg/dL — AB (ref 65–99)
Glucose-Capillary: 112 mg/dL — ABNORMAL HIGH (ref 65–99)

## 2017-10-11 MED ORDER — MORPHINE SULFATE (CONCENTRATE) 20 MG/ML PO SOLN: 5.0000 mg | ORAL | 0 refills | Status: AC | PRN

## 2017-10-11 MED ORDER — LEVALBUTEROL HCL 1.25 MG/0.5ML IN NEBU: 1.2500 mg | INHALATION_SOLUTION | Freq: Once | RESPIRATORY_TRACT | Status: DC

## 2017-10-11 MED ORDER — LORAZEPAM 0.5 MG PO TABS: 0.5000 mg | ORAL_TABLET | ORAL | 0 refills | Status: AC | PRN

## 2017-10-11 MED ORDER — IPRATROPIUM-ALBUTEROL 0.5-2.5 (3) MG/3ML IN SOLN: 3.0000 mL | Freq: Four times a day (QID) | RESPIRATORY_TRACT | Status: DC | PRN

## 2017-10-11 NOTE — Progress Notes (Signed)
Progress Note  Patient Name: Mary Harrison Date of Encounter: 10/11/2017  Primary Cardiologist: Donato Schultz, MD   Subjective   Sleepy, daughters at bedside. No complaints.   Inpatient Medications    Scheduled Meds: . levalbuterol  1.25 mg Nebulization Once  . LORazepam  0.5 mg Intravenous Once  . metoprolol tartrate  5 mg Intravenous Q6H  . sertraline  50 mg Oral Daily   Continuous Infusions: . cefTRIAXone (ROCEPHIN)  IV Stopped (10/11/17 0024)  . sodium chloride     PRN Meds: acetaminophen, diphenhydrAMINE, glycopyrrolate **OR** glycopyrrolate **OR** glycopyrrolate, haloperidol **OR** haloperidol **OR** haloperidol lactate, LORazepam, morphine CONCENTRATE **OR** morphine CONCENTRATE, nitroGLYCERIN, ondansetron (ZOFRAN) IV   Vital Signs    Vitals:   10/11/17 0300 10/11/17 0312 10/11/17 0316 10/11/17 0851  BP:   136/69 (!) 115/58  Pulse:   (!) 110   Resp: (!) 33  (!) 24   Temp:   98.1 F (36.7 C) 99 F (37.2 C)  TempSrc:   Axillary Axillary  SpO2:   96% 90%  Weight:  182 lb 1.6 oz (82.6 kg)      Intake/Output Summary (Last 24 hours) at 10/11/2017 1012 Last data filed at 10/11/2017 0500 Gross per 24 hour  Intake 2100 ml  Output 1600 ml  Net 500 ml   Filed Weights   10/09/17 0256 10/10/17 0435 10/11/17 0312  Weight: 185 lb 13.6 oz (84.3 kg) 190 lb 9.6 oz (86.5 kg) 182 lb 1.6 oz (82.6 kg)    Telemetry    NSR - Personally Reviewed  ECG    No new - Personally Reviewed  Physical Exam   GEN: No acute distress.   Neck: No JVD Cardiac: RRR, no murmurs, rubs, or gallops.  Respiratory: Clear to auscultation bilaterally. GI: Soft, nontender, non-distended  MS: No edema; No deformity. Neuro:  Nonfocal  Psych: Normal affect   Labs    Chemistry Recent Labs  Lab 10/08/17 1439 10/09/17 0847 10/10/17 0728  NA 143 137 136  K 2.7* 2.7* 4.2  CL 105 102 103  CO2 23 23 17*  GLUCOSE 97 88 105*  BUN 10 14 12   CREATININE 0.78 0.67 0.59  CALCIUM 8.7* 8.0*  8.5*  PROT 6.1*  --   --   ALBUMIN 3.2*  --   --   AST 52*  --   --   ALT 28  --   --   ALKPHOS 71  --   --   BILITOT 1.1  --   --   GFRNONAA >60 >60 >60  GFRAA >60 >60 >60  ANIONGAP 15 12 16*     Hematology Recent Labs  Lab 10/08/17 1439 10/09/17 0607 10/10/17 0728  WBC 14.0* 16.8* 11.5*  RBC 4.56 4.27 4.59  HGB 14.3 13.0 14.0  HCT 43.4 40.7 44.4  MCV 95.2 95.3 96.7  MCH 31.4 30.4 30.5  MCHC 32.9 31.9 31.5  RDW 14.2 14.5 14.5  PLT 214 196 160    Cardiac Enzymes Recent Labs  Lab 10/09/17 0607 10/09/17 0847  TROPONINI 11.79* 12.99*    Recent Labs  Lab 10/08/17 1443 10/08/17 1744  TROPIPOC 4.22* 7.28*     BNP Recent Labs  Lab 10/09/17 0607  BNP 1,779.5*     DDimer No results for input(s): DDIMER in the last 168 hours.   Radiology    Dg Chest Port 1 View  Result Date: 10/09/2017 CLINICAL DATA:  Dyspnea. EXAM: PORTABLE CHEST 1 VIEW COMPARISON:  Radiograph of October 08, 2017.  FINDINGS: Stable cardiomediastinal silhouette. Atherosclerosis of thoracic aorta is noted. Hypoinflation of the lungs is noted with mild bibasilar subsegmental atelectasis. Increased bilateral perihilar densities are noted which may represent edema or crowding of vascular structures due to hypoinflation. No pneumothorax or pleural effusion is noted. Bony thorax is unremarkable. IMPRESSION: Hypoinflation of the lungs is noted with mild bibasilar subsegmental atelectasis. Increased bilateral perihilar densities are noted which may represent mild edema or possibly crowding of vascular structures due to hypoinflation. Aortic Atherosclerosis (ICD10-I70.0). Electronically Signed   By: Lupita RaiderJames  Green Jr, M.D.   On: 10/09/2017 14:18    Cardiac Studies   EF 35-40%, no thrombus  Patient Profile     84 y.o. female with MI, apical hypokinesis, EF 35%, dementia, ETOH, SBO now comfort care  Assessment & Plan    Appreciate palliative care consult.  Agree with plan. Goal is for her comfort with  compassionate care.  Please let me know if I can be of further assistance.   For questions or updates, please contact [facility] Please consult [email or web address] for contact info under Cardiology/STEMI.      Signed, Donato SchultzMark Boston Cookson, MD  10/11/2017, 10:12 AM

## 2017-10-11 NOTE — Progress Notes (Signed)
Daily Progress Note   Patient Name: Mary Harrison       Date: 10/11/2017 DOB: 1930-12-10  Age: 84 y.o. MRN#: [id number] Attending Physician: Janece Canterbury, MD Primary Care Physician: Daphene Calamity, MD Admit Date: 10/08/2017  Reason for Consultation/Follow-up: Establishing goals of care  Subjective: 84 y.o. female  with past medical history of dementia, HTN, hyperlipidemia, depression admitted on 10/08/2017 with nausea, vomiting, abdominal pain, and chest pain. Workup revealed initially NSTEMI, UTI, and SBO. During admission EKG indicated STEMI, troponins continued to trend up. ECHO shows HF with EF 35-40%- with large area of akinesis. She was started on medical management for STEMI (not candidate for invasive interventions) and bowel rest for SBO. CIWA protocol also initiated for history of daily 1.5L alcohol intake. Was made comfort care per family wishes.   Today, patient is comfortable appearing. She is tolerating bites/sips. Two daughters are at bedside.    Length of Stay: 3  Current Medications: Scheduled Meds:  . levalbuterol  1.25 mg Nebulization Once  . LORazepam  0.5 mg Intravenous Once  . metoprolol tartrate  5 mg Intravenous Q6H  . sertraline  50 mg Oral Daily    Continuous Infusions: . cefTRIAXone (ROCEPHIN)  IV Stopped (10/11/17 0024)  . sodium chloride      PRN Meds: acetaminophen, diphenhydrAMINE, glycopyrrolate **OR** glycopyrrolate **OR** glycopyrrolate, haloperidol **OR** haloperidol **OR** haloperidol lactate, LORazepam, morphine CONCENTRATE **OR** morphine CONCENTRATE, nitroGLYCERIN, ondansetron (ZOFRAN) IV  Physical Exam  Constitutional: She appears well-developed and well-nourished.  NAD, resting in bed, comfortable appearing  Cardiovascular: Normal  rate.  Pulmonary/Chest: Effort normal.  Neurological: She is alert.  Nursing note and vitals reviewed.           Vital Signs: BP (!) 115/58 (BP Location: Left Arm)   Pulse (!) 110   Temp 99 F (37.2 C) (Axillary)   Resp (!) 24   Wt 82.6 kg (182 lb 1.6 oz)   SpO2 90%   BMI 31.26 kg/m  SpO2: SpO2: 90 % O2 Device: O2 Device: Room Air O2 Flow Rate: O2 Flow Rate (L/min): 2 L/min  Intake/output summary:   Intake/Output Summary (Last 24 hours) at 10/11/2017 1105 Last data filed at 10/11/2017 0500 Gross per 24 hour  Intake 2100 ml  Output 1600 ml  Net 500 ml   LBM: Last BM Date:  10/10/17 Baseline Weight: Weight: 84.3 kg (185 lb 13.6 oz) Most recent weight: Weight: 82.6 kg (182 lb 1.6 oz)       Palliative Assessment/Data:      Patient Active Problem List   Diagnosis Date Noted  . Goals of care, counseling/discussion   . NSTEMI (non-ST elevated myocardial infarction) ([place]) 10/08/2017  . SBO (small bowel obstruction) ([place]) 10/08/2017  . Sepsis ([place]) 10/08/2017  . Hypokalemia 10/08/2017  . Alcohol use 10/08/2017  . UTI (urinary tract infection) 10/08/2017  . Memory deficits 05/11/2015  . Acute pharyngitis 12/07/2014  . Insomnia 07/15/2013  . Dizziness 05/21/2013  . Weakness generalized 05/04/2011  . Palpitation 12/08/2010  . Hypertension   . Hypercholesteremia   . Headache   . Dyslipidemia   . Dementia     Palliative Care Assessment & Plan   Assessment: I met with patient's two daughters who are planning services in the home in anticipation of discharge tomorrow with hospice. I answered their questions about end of life and hospice care. Both are supportive of pt's wishes to return home.   Recommendations/Plan:  Discharge home with hospice  Goals of Care and Additional Recommendations:  Limitations on Scope of Treatment: Full Comfort Care  Code Status:    Code Status Orders  (From admission, onward)        Start     Ordered   10/10/17 1624  Do not  attempt resuscitation (DNR)  Continuous    Question Answer Comment  In the event of cardiac or respiratory ARREST Do not call a "code blue"   In the event of cardiac or respiratory ARREST Do not perform Intubation, CPR, defibrillation or ACLS   In the event of cardiac or respiratory ARREST Use medication by any route, position, wound care, and other measures to relive pain and suffering. May use oxygen, suction and manual treatment of airway obstruction as needed for comfort.      10/10/17 1624    Code Status History    Date Active Date Inactive Code Status Order ID Comments User Context   10/10/2017 15:55 10/10/2017 16:24 DNR [id number]  Earlie Counts, NP Inpatient   10/08/2017 20:20 10/10/2017 15:55 Full Code [id number]  Ivor Costa, MD ED       Prognosis:   < 6 months  Discharge Planning:  Home with Hospice  Care plan was discussed with family, nurse  Thank you for allowing the Palliative Medicine Team to assist in the care of this patient.   Time In: 1030 Time Out: 1100 Total Time 30 minutes Prolonged Time Billed  No      Greater than 50%  of this time was spent counseling and coordinating care related to the above assessment and plan.  Irean Hong, NP  Please contact Palliative Medicine Team phone at [phone] for questions and concerns.

## 2017-10-11 NOTE — Care Management Note (Addendum)
Case Management Note  Patient Details  Name: Mary Harrison MRN: [id number] Date of Birth: April 30, 1931  Subjective/Objective:  Pt presented for Nstemi. Hx of Nstemi, ETOH previously CIWA. Palliative Care Consulted and recommendations will be for home with Hospice Services .                  Action/Plan: CM did meet with daughter Mary AspCindy 10-11-17  [phone]). We discussed Hospice Services- Agency List Provided and patient wanted to discuss with family in regards to Agency. [facility]. CM did call the Referral Line and referral made. SOC to begin within 24-48 hours post d/c. Pt will return to her home with family support with [facility] that will assist in the 24 hour supervision. Daughter wants DME RW, 3n1, over-bed table and possible 02. Per daughter pt would like to use her own bed initially and she will look into getting side rails. [facility] to speak with family in regards to DME. Pt to travel home via ambulance. CM will speak with Hospice Liaison to see when DME can be delivered to see if pt will be able to be d/c 10-11-17. No further needs from this CM.   Expected Discharge Date:                  Expected Discharge Plan:  Home w Hospice Care  In-House Referral:  Hospice / Palliative Care  Discharge planning Services  CM Consult  Post Acute Care Choice:  Hospice Choice offered to:  Patient, Adult Children  DME Arranged:  3-N-1, Oxygen, Overbed table, Walker rolling, Other see comment(side rails for patient's own bed. ) DME Agency:   [facility]  [facility]  [facility]   [facility].   Status of Service:  Completed.  If discussed at Long Length of Stay Meetings, dates discussed:    Additional Comments: 1232 10-11-17 Mary PriestBenda Graves-Bigelow, RN,BSN [id number] [facility] did meet with the patient. Hospice will have a RN to meet the patient today in the  home. DME will be delivered today and will not be a barrier to d/c. Pt will go home via ambulance and daughter was asking if patient can be ready by 2:00 pm. Message sent to MD via Amion-. CM to follow.  Mary LewandowskyGraves-Bigelow, Mary Beaston Kaye, RN 10/11/2017, 10:55 AM

## 2017-10-11 NOTE — Discharge Instructions (Signed)
Acute Coronary Syndrome °Acute coronary syndrome (ACS) is a serious problem in which there is suddenly not enough blood and oxygen reaching the heart. ACS can result in chest pain or a heart attack. °What are the causes? °This condition may be caused by: °· A buildup of fat and cholesterol inside of the arteries (atherosclerosis). This is the most common cause. The buildup (plaque) can cause the blood vessels in your heart (coronary arteries) to become narrow or blocked. Plaque can also break off to form a clot. °· A coronary spasm. °· A tearing of the coronary artery (spontaneous coronary artery dissection). °· Low blood pressure (hypotension). °· An abnormal heart beat (arrhythmia). °· Using cocaine or methamphetamine. ° °What increases the risk? °The following factors may make you more likely to develop this condition: °· Age. °· History of chest pain, heart attack, or stroke. °· Being female. °· Family history of chest pain, heart disease, or stroke. °· Smoking. °· Inactivity. °· Being overweight. °· High cholesterol. °· High blood pressure (hypertension). °· Diabetes. °· Excessive alcohol use. ° °What are the signs or symptoms? °Common symptoms of this condition include: °· Chest pain. The pain may last long, or may stop and come back (recur). It may feel like: °? Crushing or squeezing. °? Tightness, pressure, fullness, or heaviness. °· Arm, neck, jaw, or back pain. °· Heartburn or indigestion. °· Shortness of breath. °· Nausea. °· Sudden cold sweats. °· Lightheadedness. °· Dizziness. °· Tiredness (fatigue). ° °Sometimes there are no symptoms. °How is this diagnosed? °This condition may be diagnosed through: °· An electrocardiogram (ECG). This test records the impulses of the heart. °· Blood tests. °· A CT scan of the chest. °· A coronary angiogram. This procedure checks for a blockage in the coronary arteries. ° °How is this treated? °Treatment for this condition may include: °· Oxygen. °· Medicines, such  as: °? Antiplatelet medicines and blood-thinning medicines, such as aspirin. These help prevent blood clots. °? Fibrinolytic therapy. This breaks apart a blood clot. °? Blood pressure medicines. °? Nitroglycerin. °? Pain medicine. °? Cholesterol medicine. °· A procedure called coronary angioplasty and stenting. This is done to widen a narrowed artery and keep it open. °· Coronary artery bypass surgery. This allows blood to pass the blockage to reach your heart. °· Cardiac rehabilitation. This is a program that helps improve your health and well-being. It includes exercise training, education, and counseling to help you recover. ° °Follow these instructions at home: °Eating and drinking °· Follow a heart-healthy, low-salt (sodium) diet. °· Use healthy cooking methods such as roasting, grilling, broiling, baking, poaching, steaming, or stir-frying. °· Talk to a dietitian to learn about healthy cooking methods and how to eat less sodium. °Medicines °· Take over-the-counter and prescription medicines only as told by your health care provider. °· Do not take these medicines unless your health care provider approves: °? Nonsteroidal anti-inflammatory drugs (NSAIDs), such as ibuprofen, naproxen, or celecoxib. °? Vitamin supplements that contain vitamin A or vitamin E. °? Hormone replacement therapy that contains estrogen. °Activity °· Join a cardiac rehabilitation program. °· Ask your health care provider: °? What activities and exercises are safe for you. °? If you should follow specific instructions about lifting, driving, or climbing stairs. °· If you are taking aspirin and another blood thinning medicine, avoid activities that are likely to result in an injury. The medicines can increase your risk of bleeding. °Lifestyle °· Do not use any products that contain nicotine or tobacco, such as cigarettes   and e-cigarettes. If you need help quitting, ask your health care provider. °· If you drink alcohol and your health care  provider says it is okay to drink, limit your alcohol intake to no more than 1 drink per day. One drink equals 12 oz of beer, 5 oz of wine, or 1½ oz of hard liquor. °· Maintain a healthy weight. If you need to lose weight, do it in a way that has been approved by your health care provider. °General instructions °· Tell all your health care providers about your heart condition, including your dentist. Some medicines can increase your risk of arrhythmia. °· Manage other health conditions, such as hypertension and diabetes. These conditions affect your heart. °· Learn ways to manage stress. °· Get screened for depression, and seek treatment if needed. °· Monitor your blood pressure if told by your health care provider. °· Keep your vaccinations up to date. Get the annual influenza vaccine. °· Keep all follow-up visits as told by your health care provider. This is important. °Contact a health care provider if: °· You feel overwhelmed or sad. °· You have trouble with your daily activities. °Get help right away if: °· You have pain in your chest, neck, arm, jaw, stomach, or back that recurs, and: °? Lasts more than a few minutes. °? Is not relieved by taking the medicineyour health care provider prescribed. °· You have unexplained: °? Heavy sweating. °? Heartburn or indigestion. °? Shortness of breath. °? Difficulty breathing. °? Nausea or vomiting. °? Fatigue. °? Nervousness or anxiety. °? Weakness. °? Diarrhea. °? Dark stools or blood in the stool. °· You have sudden lightheadedness or dizziness. °· Your blood pressure is higher than 180/120 °· You faint. °· You feel like hurting yourself or think about taking your own life. °These symptoms may represent a serious problem that is an emergency. Do not wait to see if the symptoms will go away. Get medical help right away. Call your local emergency services (911 in the [place] Do not drive yourself to the clinic or hospital. °Summary °· Acute coronary syndrome (ACS) is a  when there is not enough blood and oxygen being supplied to the heart. ACS can result in chest pain or a heart attack. °· Acute coronary syndrome is a medical emergency. If you have any symptoms of this condition, get help right away. °· Treatment includes oxygen, medicines, and procedures to open the blocked arteries and restore blood flow. °This information is not intended to replace advice given to you by your health care provider. Make sure you discuss any questions you have with your health care provider. °Document Released: 07/23/2005 Document Revised: 08/24/2016 Document Reviewed: 08/24/2016 °[employer] Interactive Patient Education © 2018 [employer] ° °

## 2017-10-11 NOTE — Discharge Summary (Signed)
Physician Discharge Summary  Mary Harrison SWN:[id number] DOB: 06/15/1931 DOA: 10/08/2017  PCP: Daphene Calamity, MD  Admit date: 10/08/2017 Discharge date: 10/11/2017  Admitted From: Home Disposition: Home with hospice   Recommendations for Outpatient Follow-up:    1.  Outpatient follow-up with cardiology if the family feels necessary 2.  PCP to supervise hospice services at home  Home Health: Hospice and palliative care [facility] Equipment/Devices: Rolling walker, 3 and 1, over bed table, oxygen  Discharge Condition:  Stable, improved CODE STATUS: DNR Diet recommendation: Regular diet  Brief/Interim Summary:  The patient is an 84 year old female with dementia, hypertension, hyperlipidemia, migraine headaches, depression, alcohol use who presented with nausea, vomiting, abdominal and chest pains.  She was found to have an NSTEMI initially and CT of abdomen and pelvis demonstrated a small bowel obstruction.  General surgery and cardiology were consulted.    She was initially placed on aspirin, statin, beta-blocker and heparin drip.  A subsequent EKG was more concerning for STEMI.  Cardiology met with the family and the family elected medical management of her heart attack and declined heart catheterization.  They met with palliative care on 3/7 and discussed her complicated case with her bowel obstruction and her heart attack and dilated cardiomyopathy.  They have elected for comfort measures and would like her to go home with hospice as soon as possible.    She had several bowel movements and her diet was advanced to a soft diet on 3/8.  The family met with case management and arrange for home hospice with some equipment which will be delivered soon.  He denies shortness of breath or chest pains or pressure at the time of discharge.  We discussed stopping all medications that are not directly related to symptom management.  If the family has second thoughts about stopping his medications  they were advised to speak to the patient's supervising physician or her primary care doctor.   Discharge Diagnoses:  Principal Problem:   NSTEMI (non-ST elevated myocardial infarction) ([place]) Active Problems:   Hypertension   Hypercholesteremia   Dementia   SBO (small bowel obstruction) (HCC)   Sepsis (HCC)   Hypokalemia   Alcohol use   UTI (urinary tract infection)   Goals of care, counseling/discussion  Acute MI, initially NSTEMI, but appeared STEMI on subsequent EKG on 3/6, Troponin 12.99 on 3/6, medical management - Appreciate cardiology assistance  - d/c rectal aspirin, heparin, IV metoprolol  - ECHO: Moderate LV dysfunction, ejection fraction 35-40% with akinesis of the apical myocardium, grade 1 diastolic dysfunction, no prior for comparison.  No evidence of apical thrombus on repeat echocardiogram - Morphine as needed for shortness of breath or pain  Hypokalemia: Resolved with IV potassium repletion.  No further blood work  Wheezing, likely due to acute systolic heart failure from MI and IVF -  CXR:  Vascular congestion -  morphine as needed for shortness of breath  Small bowel obstruction with SIRS criteria, Pt is poorcandidate for surgery.    She had improvement in her bowel sounds and had 3 bowel movements.  Her diet was advanced to a soft diet. - appreciate General Surgery assistance -Advance gradually to a regular diet  Dementia:On Namenda.Previously quite agitated. Settled with benzodiazepines. -Family would like to stop oral namenda anddonepezil because they do not feel that they made any improvement -As needed Ativan  Alcohol use:1.5 L of wine daily. -PRN Ativan  HTN/HLD:  BP stable, focusing on symptom management  Possible UTI, urine culture not obtained -  Ceftriaxone given in hospital.  No further treatment at this time  Depression:Stable, no suicidal or homicidal ideations. -  Continued Zoloft    Discharge  Instructions   Allergies as of 10/11/2017      Reactions   Mevacor [lovastatin]    Leg cramps   Olanzapine    Ringing in the ears   Paxil [paroxetine Hcl]    nausea   Namenda [memantine Hcl] Other (See Comments)   headache   Toprol Xl [metoprolol Succinate] Palpitations      Medication List    STOP taking these medications   amLODipine 10 MG tablet Commonly known as:  NORVASC   atorvastatin 20 MG tablet Commonly known as:  LIPITOR   BENICAR HCT 40-12.5 MG tablet Generic drug:  olmesartan-hydrochlorothiazide   carvedilol 12.5 MG tablet Commonly known as:  COREG   carvedilol 25 MG tablet Commonly known as:  COREG   donepezil 10 MG tablet Commonly known as:  ARICEPT   memantine 5 MG tablet Commonly known as:  NAMENDA   potassium chloride SA 20 MEQ tablet Commonly known as:  K-DUR,KLOR-CON     TAKE these medications   BC HEADACHE POWDER PO Take 1 packet by mouth 2 (two) times daily as needed (pain).   diclofenac sodium 1 % Gel Commonly known as:  VOLTAREN Apply 4 g topically 4 (four) times daily as needed for pain.   dimenhyDRINATE 50 MG tablet Commonly known as:  DRAMAMINE Take 50 mg by mouth as needed for nausea.   diphenhydramine-acetaminophen 25-500 MG Tabs tablet Commonly known as:  TYLENOL PM Take 1 tablet by mouth at bedtime.   LORazepam 0.5 MG tablet Commonly known as:  ATIVAN Take 1 tablet (0.5 mg total) by mouth every 4 (four) hours as needed for anxiety.   morphine 20 MG/ML concentrated solution Commonly known as:  ROXANOL Take 0.25 mLs (5 mg total) by mouth every 2 (two) hours as needed for severe pain.   sertraline 50 MG tablet Commonly known as:  ZOLOFT TAKE 3 TABLETS(150 MG) BY MOUTH DAILY      Follow-up Information    [facility] Follow up.   Specialty:  Hospice and Palliative Medicine Why:  Registered Nurse Contact information: [place] [address] [phone]        Daphene Calamity, MD Follow up.   Specialty:  Family Medicine Contact information: [place] [address] [phone]        Jerline Pain, MD .   Specialty:  Cardiology Contact information: [phone] [place] [address] [phone]          Allergies  Allergen Reactions  . Mevacor [Lovastatin]     Leg cramps  . Olanzapine     Ringing in the ears  . Paxil [Paroxetine Hcl]     nausea  . Namenda [Memantine Hcl] Other (See Comments)    headache  . Toprol Xl [Metoprolol Succinate] Palpitations    Consultations: Audiology General surgery   Procedures/Studies: Ct Abdomen Pelvis W Contrast  Result Date: 10/08/2017 CLINICAL DATA:  Nausea and vomiting.  Generalized weakness. EXAM: CT ABDOMEN AND PELVIS WITH CONTRAST TECHNIQUE: Multidetector CT imaging of the abdomen and pelvis was performed using the standard protocol following bolus administration of intravenous contrast. CONTRAST:  115m ISOVUE-300 IOPAMIDOL (ISOVUE-300) INJECTION 61% COMPARISON:  Abdominal radiographs from earlier today. FINDINGS: Lower chest: Scattered parenchymal bands at both lung bases compatible with mild scarring or atelectasis. Hepatobiliary: Diffuse hepatic  steatosis. No definite liver surface irregularity. No liver mass. Small venovenous shunt in the inferior segment 3 left liver lobe (series 8/image 10). Normal gallbladder with no radiopaque cholelithiasis. No biliary ductal dilatation. Pancreas: Normal, with no mass or duct dilation. Spleen: Normal size. No mass. Adrenals/Urinary Tract: Normal adrenals. Hypodense subcentimeter interpolar left renal cortical focus is too small to characterize and requires no follow-up. Otherwise normal kidneys, with no hydronephrosis. Normal bladder. Stomach/Bowel: Normal non-distended stomach. There is a small infraumbilical ventral hernia to the right of midline containing a tiny portion of a mildly thick walled small bowel loop (series  7/image 91 and series 6/images 33-37), proximal to which there a few mildly dilated small bowel loops with air-fluid levels measuring up to 4.0 cm diameter, and proximal to which the small bowel is relatively decompressed. No additional sites of small bowel wall thickening. Appendix not discretely visualized. No pericecal inflammatory changes. Postsurgical changes from partial distal colectomy with intact appearing distal colonic anastomosis. Mild diverticulosis in the remnant sigmoid colon. No large bowel wall thickening or significant pericolonic fat stranding. Vascular/Lymphatic: Atherosclerotic nonaneurysmal abdominal aorta. Patent portal, splenic, hepatic and renal veins. No pathologically enlarged lymph nodes in the abdomen or pelvis. Reproductive: Status post hysterectomy, with no abnormal findings at the vaginal cuff. No adnexal mass. Other: No pneumoperitoneum, ascites or focal fluid collection. Small fat containing periumbilical hernia. Moderate fat containing midline supraumbilical ventral abdominal hernia (series 3/image 35). Musculoskeletal: No aggressive appearing focal osseous lesions. Marked thoracolumbar spondylosis. IMPRESSION: 1. Mechanical small-bowel obstruction in the mid small bowel at the level of a small infraumbilical ventral abdominal hernia to the right of midline, which contains a tiny portion of a mildly thick walled small bowel loop. Early ischemic changes cannot be excluded in this partially herniated small bowel loop. Surgical consultation advised. 2. No pneumatosis, free air or abscess. 3. Postsurgical changes from partial distal colectomy. Mild diverticulosis in the remnant sigmoid colon. 4. Chronic findings include: Aortic Atherosclerosis (ICD10-I70.0). Diffuse hepatic steatosis. Additional small fat containing periumbilical hernia and moderate fat containing midline supraumbilical ventral abdominal hernia. Electronically Signed   By: Ilona Sorrel M.D.   On: 10/08/2017 18:32    Dg Chest Port 1 View  Result Date: 10/09/2017 CLINICAL DATA:  Dyspnea. EXAM: PORTABLE CHEST 1 VIEW COMPARISON:  Radiograph of October 08, 2017. FINDINGS: Stable cardiomediastinal silhouette. Atherosclerosis of thoracic aorta is noted. Hypoinflation of the lungs is noted with mild bibasilar subsegmental atelectasis. Increased bilateral perihilar densities are noted which may represent edema or crowding of vascular structures due to hypoinflation. No pneumothorax or pleural effusion is noted. Bony thorax is unremarkable. IMPRESSION: Hypoinflation of the lungs is noted with mild bibasilar subsegmental atelectasis. Increased bilateral perihilar densities are noted which may represent mild edema or possibly crowding of vascular structures due to hypoinflation. Aortic Atherosclerosis (ICD10-I70.0). Electronically Signed   By: Marijo Conception, M.D.   On: 10/09/2017 14:18   Dg Chest Portable 1 View  Result Date: 10/08/2017 CLINICAL DATA:  Nausea and vomiting today. EXAM: PORTABLE CHEST 1 VIEW COMPARISON:  PA and lateral chest 04/13/2010.  CT chest 04/14/2010. FINDINGS: Heart size is upper normal. There is aortic atherosclerosis. Lungs are clear. No pneumothorax or pleural effusion. No acute bony abnormality. IMPRESSION: No acute disease. Electronically Signed   By: Inge Rise M.D.   On: 10/08/2017 15:28   Dg Abd Portable 2 Views  Result Date: 10/08/2017 CLINICAL DATA:  Nausea and vomiting today. EXAM: PORTABLE ABDOMEN - 2 VIEW COMPARISON:  None. FINDINGS:  The bowel gas pattern is normal. There is no evidence of free air. No radio-opaque calculi or other significant radiographic abnormality is seen. IMPRESSION: Negative exam. Electronically Signed   By: Inge Rise M.D.   On: 10/08/2017 15:27      Subjective: Patient denies chest pains, abdominal pains, shortness of breath.  She required 1 dose of Ativan last night for agitation and 1 dose of morphine because of labored breathing around 3 AM.  She  drank clear liquids very well yesterday.  She had 3 bowel movements since yesterday.  Discharge Exam: Vitals:   10/11/17 0851 10/11/17 1248  BP: (!) 115/58 134/67  Pulse:  (!) 102  Resp:    Temp: 99 F (37.2 C)   SpO2: 90% 95%   Vitals:   10/11/17 0312 10/11/17 0316 10/11/17 0851 10/11/17 1248  BP:  136/69 (!) 115/58 134/67  Pulse:  (!) 110  (!) 102  Resp:  (!) 24    Temp:  98.1 F (36.7 C) 99 F (37.2 C)   TempSrc:  Axillary Axillary   SpO2:  96% 90% 95%  Weight: 82.6 kg (182 lb 1.6 oz)       General: Pt is alert, awake, not in acute distress Cardiovascular: RRR, S1/S2 +, no rubs, no gallops Respiratory: CTA bilaterally, no wheezing, no rhonchi Abdominal: Soft, NT, mildly distended, bowel sounds + Extremities: no edema, no cyanosis    The results of significant diagnostics from this hospitalization (including imaging, microbiology, ancillary and laboratory) are listed below for reference.     Microbiology: Recent Results (from the past 240 hour(s))  MRSA PCR Screening     Status: None   Collection Time: 10/09/17  2:47 AM  Result Value Ref Range Status   MRSA by PCR NEGATIVE NEGATIVE Final    Comment:        The GeneXpert MRSA Assay (FDA approved for NASAL specimens only), is one component of a comprehensive MRSA colonization surveillance program. It is not intended to diagnose MRSA infection nor to guide or monitor treatment for MRSA infections. Performed at [facility], [place] [address], [place], [place] [address]   Culture, blood (Routine X 2) w Reflex to ID Panel     Status: None (Preliminary result)   Collection Time: 10/09/17  5:45 AM  Result Value Ref Range Status   Specimen Description BLOOD LEFT HAND  Final   Special Requests   Final    BOTTLES DRAWN AEROBIC AND ANAEROBIC Blood Culture adequate volume   Culture   Final    NO GROWTH 1 DAY Performed at [facility], [place] [address] [place], [place] [address]    Report Status PENDING   Incomplete  Culture, blood (Routine X 2) w Reflex to ID Panel     Status: None (Preliminary result)   Collection Time: 10/09/17  6:00 AM  Result Value Ref Range Status   Specimen Description BLOOD RIGHT HAND  Final   Special Requests   Final    BOTTLES DRAWN AEROBIC AND ANAEROBIC Blood Culture adequate volume   Culture   Final    NO GROWTH 1 DAY Performed at [facility], [place] [address] [place], [place] [address]    Report Status PENDING  Incomplete     Labs: BNP (last 3 results) Recent Labs    10/09/17 0607  BNP 5,784.6*   Basic Metabolic Panel: Recent Labs  Lab 10/08/17 1439 10/08/17 1906 10/09/17 0607 10/09/17 0847 10/10/17 0728  NA 143  --   --  137 136  K 2.7*  --   --  2.7* 4.2  CL 105  --   --  102 103  CO2 23  --   --  23 17*  GLUCOSE 97  --   --  88 105*  BUN 10  --   --  14 12  CREATININE 0.78  --   --  0.67 0.59  CALCIUM 8.7*  --   --  8.0* 8.5*  MG  --  1.7 2.3  --   --    Liver Function Tests: Recent Labs  Lab 10/08/17 1439  AST 52*  ALT 28  ALKPHOS 71  BILITOT 1.1  PROT 6.1*  ALBUMIN 3.2*   Recent Labs  Lab 10/08/17 1439  LIPASE 26   No results for input(s): AMMONIA in the last 168 hours. CBC: Recent Labs  Lab 10/08/17 1439 10/09/17 0607 10/10/17 0728  WBC 14.0* 16.8* 11.5*  NEUTROABS 13.4*  --   --   HGB 14.3 13.0 14.0  HCT 43.4 40.7 44.4  MCV 95.2 95.3 96.7  PLT 214 196 160   Cardiac Enzymes: Recent Labs  Lab 10/09/17 0607 10/09/17 0847  TROPONINI 11.79* 12.99*   BNP: Invalid input(s): POCBNP CBG: Recent Labs  Lab 10/10/17 1140 10/10/17 1625 10/10/17 2150 10/11/17 0743 10/11/17 1209  GLUCAP 129* 119* 122* 112* 146*   D-Dimer No results for input(s): DDIMER in the last 72 hours. Hgb A1c Recent Labs    10/09/17 0607  HGBA1C 5.1   Lipid Profile Recent Labs    10/09/17 0607  CHOL 150  HDL 86  LDLCALC 49  TRIG 73  CHOLHDL 1.7   Thyroid function studies No results for input(s): TSH, T4TOTAL,  T3FREE, THYROIDAB in the last 72 hours.  Invalid input(s): FREET3 Anemia work up No results for input(s): VITAMINB12, FOLATE, FERRITIN, TIBC, IRON, RETICCTPCT in the last 72 hours. Urinalysis    Component Value Date/Time   COLORURINE YELLOW 10/08/2017 2159   APPEARANCEUR HAZY (A) 10/08/2017 2159   LABSPEC >1.046 (H) 10/08/2017 2159   PHURINE 6.0 10/08/2017 2159   GLUCOSEU NEGATIVE 10/08/2017 2159   HGBUR SMALL (A) 10/08/2017 2159   BILIRUBINUR NEGATIVE 10/08/2017 2159   KETONESUR 5 (A) 10/08/2017 2159   PROTEINUR NEGATIVE 10/08/2017 2159   NITRITE NEGATIVE 10/08/2017 2159   LEUKOCYTESUR LARGE (A) 10/08/2017 2159   Sepsis Labs Invalid input(s): PROCALCITONIN,  WBC,  LACTICIDVEN   Time coordinating discharge: Over 30 minutes  SIGNED:   Janece Canterbury, MD  [facility] 10/11/2017, 1:14 PM Pager   If 7PM-7AM, please contact night-coverage [email or web address] Password [email or web address]

## 2017-10-11 NOTE — Progress Notes (Signed)
[facility] Liaison Visit:   Notified by Steward DroneBrenda, [facility] of family request [facility] after discharge. Chart and patient information currently under review to confirm hospice eligibility.  Spoke with Arline AspCindy, daughter to initiate education related to hospice philosophy, services and team approach to care. Family verbalized understanding of the information provided. Per discussion, plan is for discharge to home by PT[facility] today.   Please send signed completed DNR form home with patient.  Patient will need prescriptions for discharge comfort medications.   DME needs discussed and family requested rolling walker, OBT, and 3N1 for delivery to the home today.  [facility] equipment manager Jewel Kizzie BaneHughes notified and will contact [facility] to arrange delivery to the home.  The home address has been verified and is correct in the chart; Arline AspCindy is family member to be contacted to arrange time of delivery.   [facility]erral [facility] aware of the above.  Completed discharge summary will need to be [facility]ed to [facility] at [phone] when final.   Please notify [facility] when patient is ready to leave unit at discharge-call [phone] or [phone] after 5PM or on weekends.  [facility] information and contact numbers have been given to Maxwellindy during visit.   Above information shared with Steward DroneBrenda, [facility]RN.  Please call with any questions.   Thank You,  Hessie KnowsStacie Wilkinson RN, BSN  [facility] Liaison  [phone]  All [facility] hospital liaisons now on AMION.

## 2017-10-11 NOTE — Progress Notes (Signed)
Patient ID: Mary Harrison, female   DOB: June 29, 1931, 84 y.o.   MRN: [id number]       Subjective: Patient sleepy.  Comfort care transition noted.  Ate some apples this morning, but feels a little bloated.  States she isn't passing flatus or had a BM yet.  Objective: Vital signs in last 24 hours: Temp:  [97.6 F (36.4 C)-99.1 F (37.3 C)] 99 F (37.2 C) (03/08 0851) Pulse Rate:  [82-110] 110 (03/08 0316) Resp:  [18-33] 24 (03/08 0316) BP: (109-136)/(48-73) 115/58 (03/08 0851) SpO2:  [90 %-96 %] 90 % (03/08 0851) Weight:  [182 lb 1.6 oz (82.6 kg)] 182 lb 1.6 oz (82.6 kg) (03/08 0312) Last BM Date: 10/10/17  Intake/Output from previous day: 03/07 0701 - 03/08 0700 In: 2340 [P.O.:1040; I.V.:1200; IV Piggyback:100] Out: 1600 [Urine:1600] Intake/Output this shift: No intake/output data recorded.  PE: Abd: soft, nontender, maybe slightly bloated, but obese so difficult to tell.  Umbilical hernia reducible.  Lab Results:  Recent Labs    10/09/17 0607 10/10/17 0728  WBC 16.8* 11.5*  HGB 13.0 14.0  HCT 40.7 44.4  PLT 196 160   BMET Recent Labs    10/09/17 0847 10/10/17 0728  NA 137 136  K 2.7* 4.2  CL 102 103  CO2 23 17*  GLUCOSE 88 105*  BUN 14 12  CREATININE 0.67 0.59  CALCIUM 8.0* 8.5*   PT/INR Recent Labs    10/09/17 0607  LABPROT 14.7  INR 1.15   CMP     Component Value Date/Time   NA 136 10/10/2017 0728   K 4.2 10/10/2017 0728   CL 103 10/10/2017 0728   CO2 17 (L) 10/10/2017 0728   GLUCOSE 105 (H) 10/10/2017 0728   BUN 12 10/10/2017 0728   CREATININE 0.59 10/10/2017 0728   CREATININE 0.61 10/26/2010 0939   CALCIUM 8.5 (L) 10/10/2017 0728   PROT 6.1 (L) 10/08/2017 1439   ALBUMIN 3.2 (L) 10/08/2017 1439   AST 52 (H) 10/08/2017 1439   ALT 28 10/08/2017 1439   ALKPHOS 71 10/08/2017 1439   BILITOT 1.1 10/08/2017 1439   GFRNONAA >60 10/10/2017 0728   GFRAA >60 10/10/2017 0728   Lipase     Component Value Date/Time   LIPASE 26 10/08/2017 1439        Studies/Results: Dg Chest Port 1 View  Result Date: 10/09/2017 CLINICAL DATA:  Dyspnea. EXAM: PORTABLE CHEST 1 VIEW COMPARISON:  Radiograph of October 08, 2017. FINDINGS: Stable cardiomediastinal silhouette. Atherosclerosis of thoracic aorta is noted. Hypoinflation of the lungs is noted with mild bibasilar subsegmental atelectasis. Increased bilateral perihilar densities are noted which may represent edema or crowding of vascular structures due to hypoinflation. No pneumothorax or pleural effusion is noted. Bony thorax is unremarkable. IMPRESSION: Hypoinflation of the lungs is noted with mild bibasilar subsegmental atelectasis. Increased bilateral perihilar densities are noted which may represent mild edema or possibly crowding of vascular structures due to hypoinflation. Aortic Atherosclerosis (ICD10-I70.0). Electronically Signed   By: Lupita Raider, M.D.   On: 10/09/2017 14:18    Anti-infectives: Anti-infectives (From admission, onward)   Start     Dose/Rate Route Frequency Ordered Stop   10/09/17 0000  cefTRIAXone (ROCEPHIN) 1 g in sodium chloride 0.9 % 100 mL IVPB     1 g 200 mL/hr over 30 Minutes Intravenous Every 24 hours 10/08/17 2338         Assessment/Plan  1. SBO Seems to be improving some.  No nausea, but does feel slightly  bloated.  Had a BM yesterday according to I/Os Diet as tolerates given comfort care  NSTEMI - comfort care, per primary service.  Plan appears to be home with hospice UTI - per primary  FEN - regular diet as tolerates VTE - none, comfort care ID - rocephin, UTI  We will plan to sign off as plan is for comfort care.  Please call if needed.   LOS: 3 days    Letha CapeKelly E Brystal Kildow , [facility] 10/11/2017, 10:51 AM Pager: [phone] Consults: [phone] Mon-Fri 7:00 am-4:30 pm Sat-Sun 7:00 am-11:30 am

## 2017-10-11 NOTE — Progress Notes (Signed)
[facility] arrived to pick up [name].  Informed Hospice of her discharge. AVS given to PTAR.  Mary DyerYoko Ashely Goosby, RN

## 2017-10-14 LAB — CULTURE, BLOOD (ROUTINE X 2)
Culture: NO GROWTH
Culture: NO GROWTH
SPECIAL REQUESTS: ADEQUATE
Special Requests: ADEQUATE

## 2017-10-15 ENCOUNTER — Ambulatory Visit: Payer: Medicare Other | Admitting: Nurse Practitioner

## 2017-11-14 ENCOUNTER — Other Ambulatory Visit: Payer: Self-pay | Admitting: Neurology

## 2017-11-14 ENCOUNTER — Telehealth: Payer: Self-pay | Admitting: Neurology

## 2017-11-14 NOTE — Telephone Encounter (Signed)
Tried calling [facility]hel[facility] or Steward DroneBrenda (both on DPR form) to discuss refill request. Called [facility]. First call disconnected, second time I got a busy signal. Herbert Setaried Brenda, but call ended.  Pt has not been seen in over a year. Also appears she is under hospice care now. If that is the case, they will need to request refill from Hospice. They would be the ones manage pt medications now.  Unable to contact Urban Gibsonindy Putnam who called (not on DPR)

## 2017-11-14 NOTE — Telephone Encounter (Signed)
Pt's daughter called to advise she will be out of sertraline (ZOLOFT) 50 MG tablet today. [facility]. She is aware script request from pharmacy has been rec'd

## 2017-11-14 NOTE — Telephone Encounter (Signed)
Called and spoke with Velna HatchetSheila. I relayed information below. She states she is out of town and Arline AspCindy is staying with her mother now. I did advise she was not on DPR form, that is why I could not call her. She understood and will call to let her know they will need to call hospice for med refill. Pt started with hospice on 10/08/17. Nothing further needed at this time.

## 2017-12-09 ENCOUNTER — Other Ambulatory Visit: Payer: Self-pay | Admitting: Neurology

## 2019-02-24 IMAGING — CT CT ABD-PELV W/ CM
2 of 5 series · 15 of 46 positions shown, 17 images · IV contrast (Omni 300)
Comparison: Abdominal radiographs from earlier today.

CLINICAL DATA: Nausea and vomiting.  Generalized weakness.

EXAM:
CT ABDOMEN AND PELVIS WITH CONTRAST
TECHNIQUE: Multidetector CT imaging of the abdomen and pelvis was performed
using the standard protocol following bolus administration of
intravenous contrast.
CONTRAST:  100mL EI2KVO-9LL IOPAMIDOL (EI2KVO-9LL) INJECTION 61%

[Series 3: a/p w/ 5mm · axial · 0.98mm/px · z∈[-137,+258]mm · 12 of 89 slices shown, 14 images]
[im 5/89  soft-tissue]
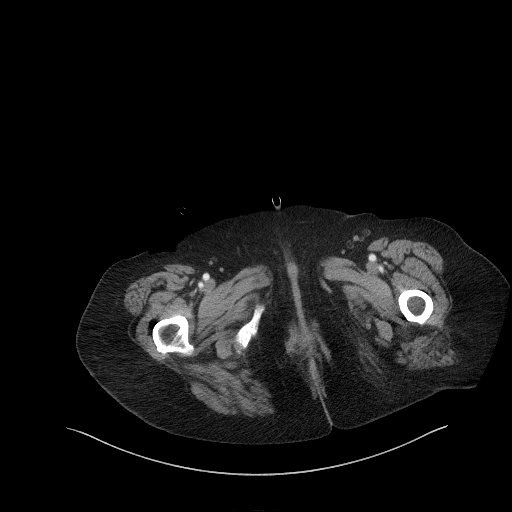
[im 5/89  bone]
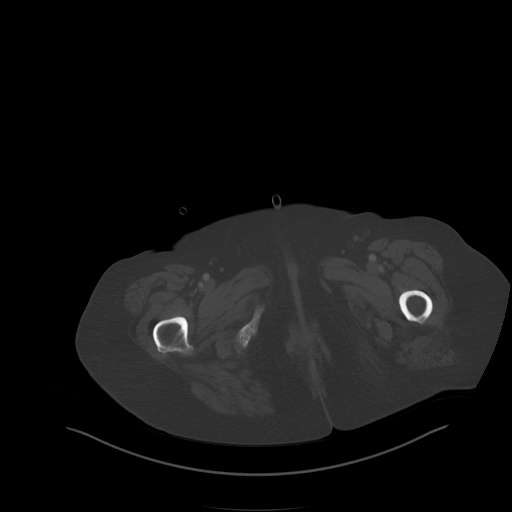
[im 14/89  soft-tissue]
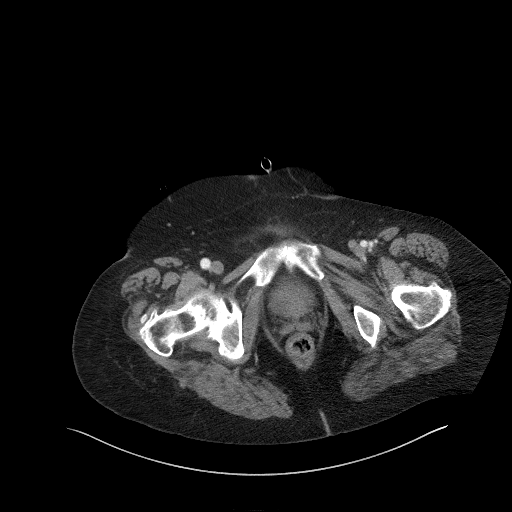
[im 18/89  soft-tissue]
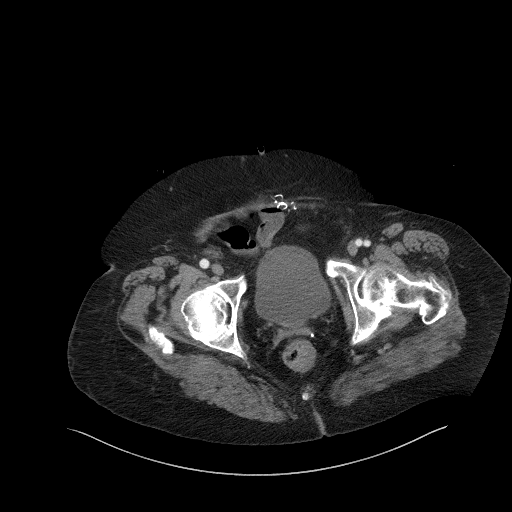
[im 27/89  soft-tissue]
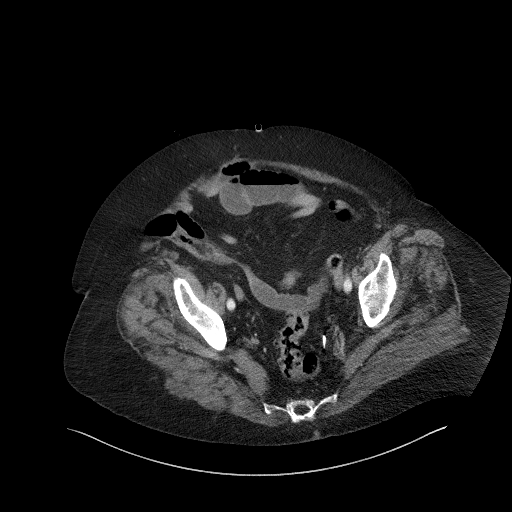
[im 36/89  soft-tissue]
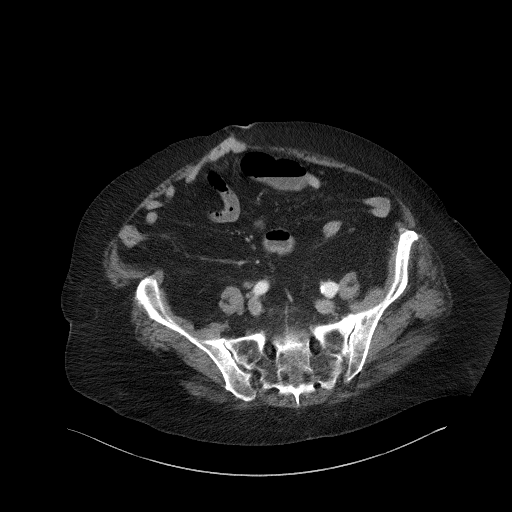
[im 40/89  soft-tissue]
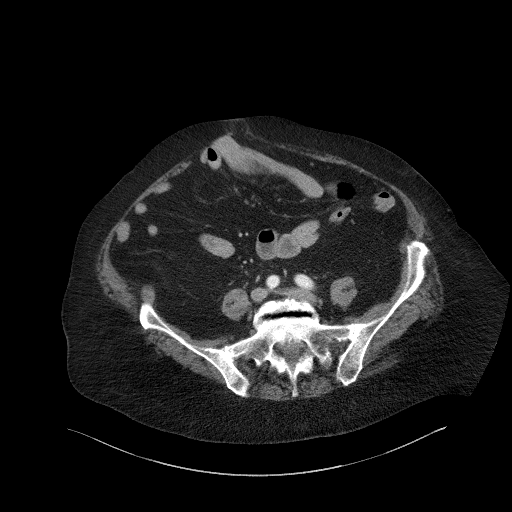
[im 49/89  soft-tissue]
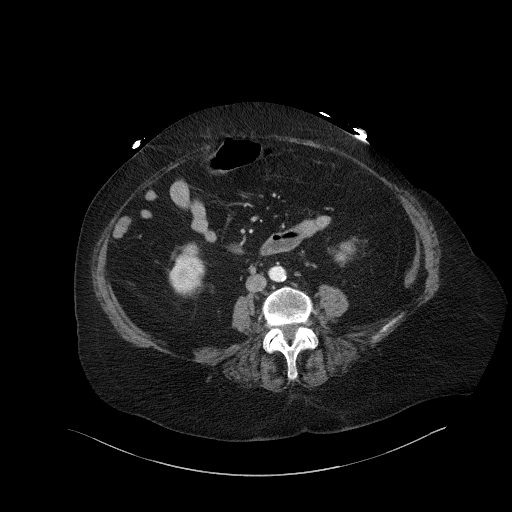
[im 53/89  soft-tissue]
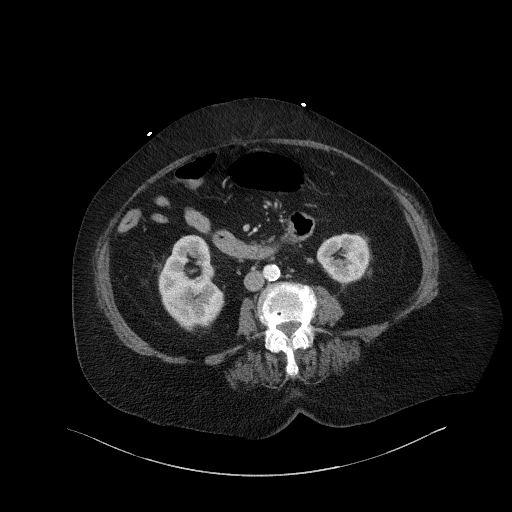
[im 62/89  soft-tissue]
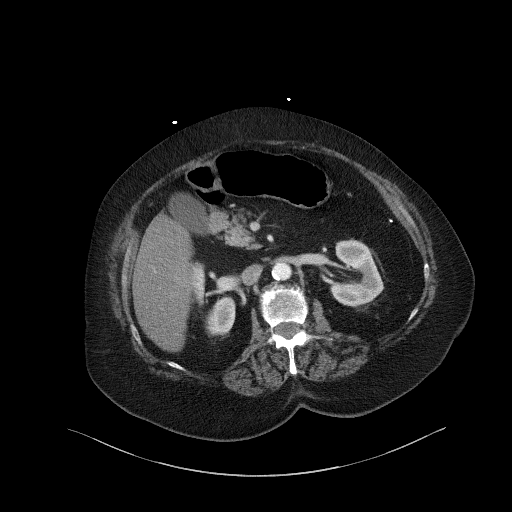
[im 62/89  bone]
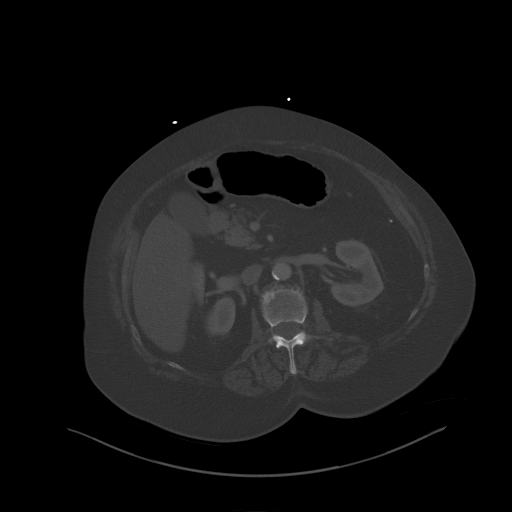
[im 71/89  soft-tissue]
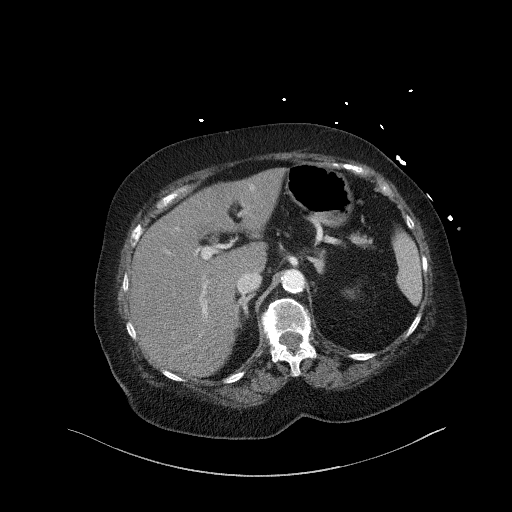
[im 75/89  soft-tissue]
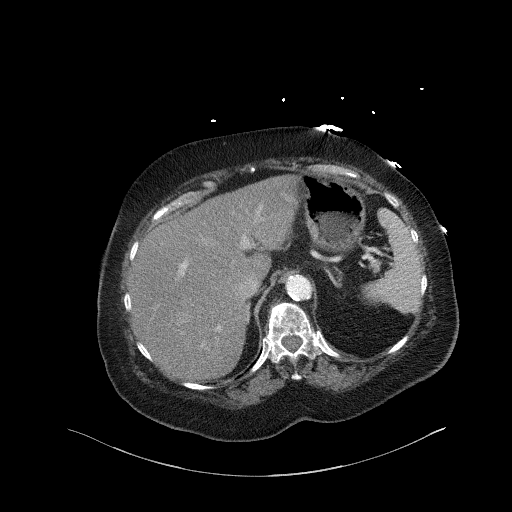
[im 84/89  soft-tissue]
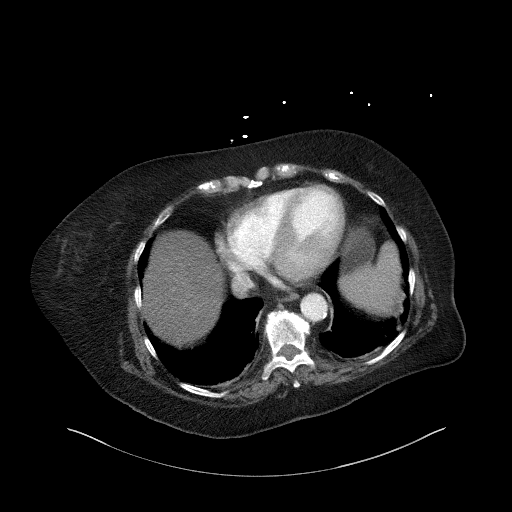

[Series 6: a/p w/ cor · coronal · 0.89mm/px · 3 of 178 slices shown]
[im 60/178  soft-tissue]
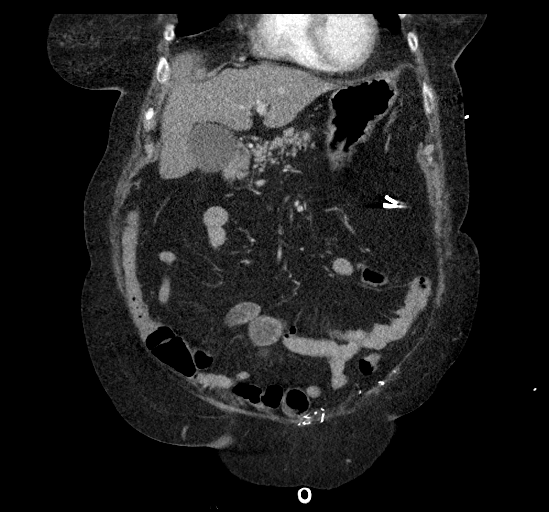
[im 79/178  soft-tissue]
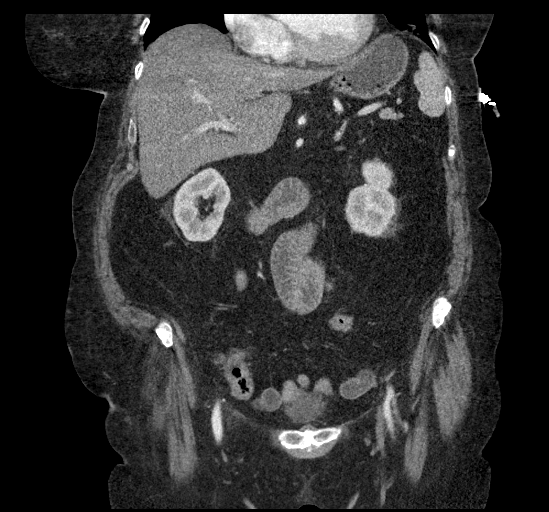
[im 99/178  soft-tissue]
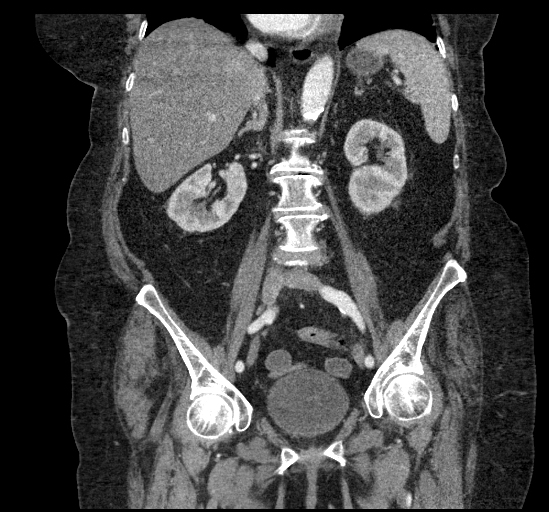

[15 of 46 positions shown; findings below may reference images not displayed]

FINDINGS: Lower chest: Scattered parenchymal bands at both lung bases
compatible with mild scarring or atelectasis.

Hepatobiliary: Diffuse hepatic steatosis. No definite liver surface
irregularity. No liver mass. Small venovenous shunt in the inferior
segment 3 left liver lobe (series 8/image 10). Normal gallbladder
with no radiopaque cholelithiasis. No biliary ductal dilatation.

Pancreas: Normal, with no mass or duct dilation.

Spleen: Normal size. No mass.

Adrenals/Urinary Tract: Normal adrenals. Hypodense subcentimeter
interpolar left renal cortical focus is too small to characterize
and requires no follow-up. Otherwise normal kidneys, with no
hydronephrosis. Normal bladder.

Stomach/Bowel: Normal non-distended stomach. There is a small
infraumbilical ventral hernia to the right of midline containing a
tiny portion of a mildly thick walled small bowel loop (series
7/image 91 and series 6/images 33-37), proximal to which there a few
mildly dilated small bowel loops with air-fluid levels measuring up
to 4.0 cm diameter, and proximal to which the small bowel is
relatively decompressed. No additional sites of small bowel wall
thickening. Appendix not discretely visualized. No pericecal
inflammatory changes. Postsurgical changes from partial distal
colectomy with intact appearing distal colonic anastomosis. Mild
diverticulosis in the remnant sigmoid colon. No large bowel wall
thickening or significant pericolonic fat stranding.

Vascular/Lymphatic: Atherosclerotic nonaneurysmal abdominal aorta.
Patent portal, splenic, hepatic and renal veins. No pathologically
enlarged lymph nodes in the abdomen or pelvis.

Reproductive: Status post hysterectomy, with no abnormal findings at
the vaginal cuff. No adnexal mass.

Other: No pneumoperitoneum, ascites or focal fluid collection. Small
fat containing periumbilical hernia. Moderate fat containing midline
supraumbilical ventral abdominal hernia (series 3/image 35).

Musculoskeletal: No aggressive appearing focal osseous lesions.
Marked thoracolumbar spondylosis.
IMPRESSION: 1. Mechanical small-bowel obstruction in the mid small bowel at the
level of a small infraumbilical ventral abdominal hernia to the
right of midline, which contains a tiny portion of a mildly thick
walled small bowel loop. Early ischemic changes cannot be excluded
in this partially herniated small bowel loop. Surgical consultation
advised.
2. No pneumatosis, free air or abscess.
3. Postsurgical changes from partial distal colectomy. Mild
diverticulosis in the remnant sigmoid colon.
4. Chronic findings include: Aortic Atherosclerosis ([id number]-GNW.W).
Diffuse hepatic steatosis. Additional small fat containing
periumbilical hernia and moderate fat containing midline
supraumbilical ventral abdominal hernia.

## 2019-02-24 IMAGING — DX DG ABD PORTABLE 2V
2 series · 2 of 2 positions shown · non-contrast
Comparison: None.

CLINICAL DATA: Nausea and vomiting today.

EXAM:
PORTABLE ABDOMEN - 2 VIEW

[abdomen erect]
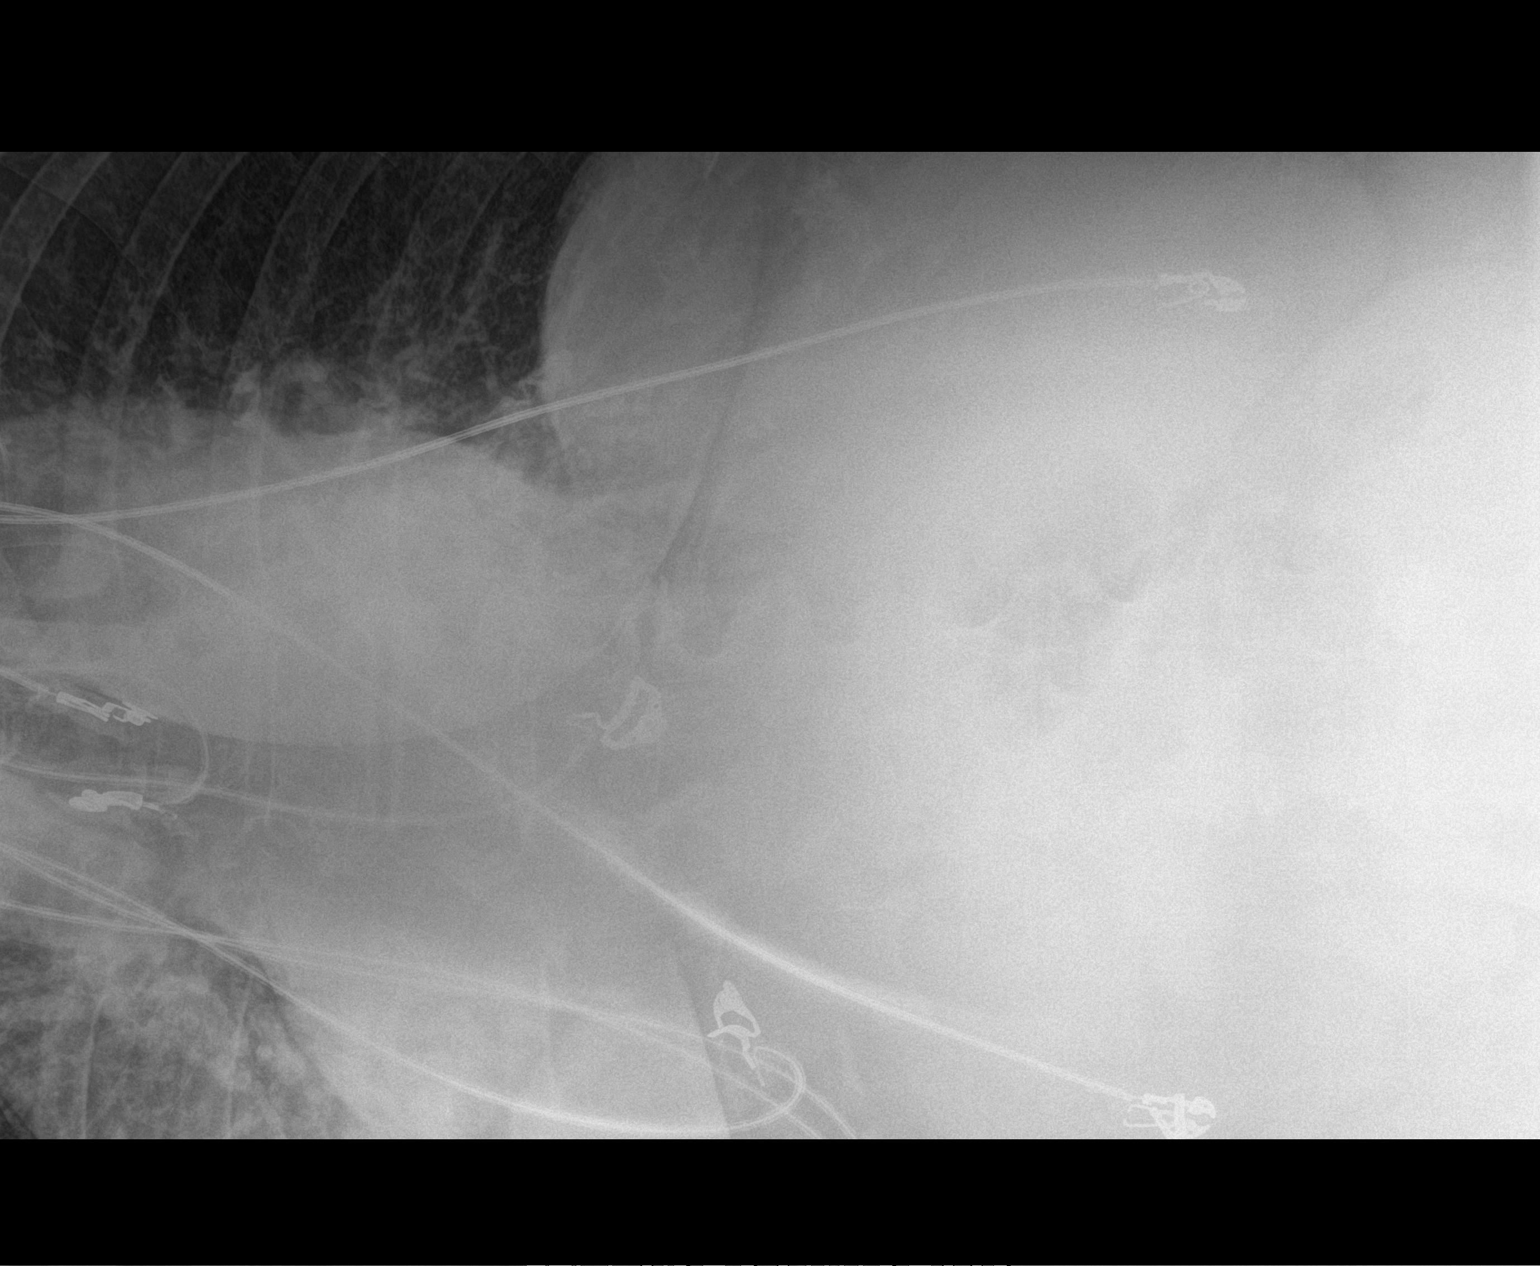

[abdomen supine]
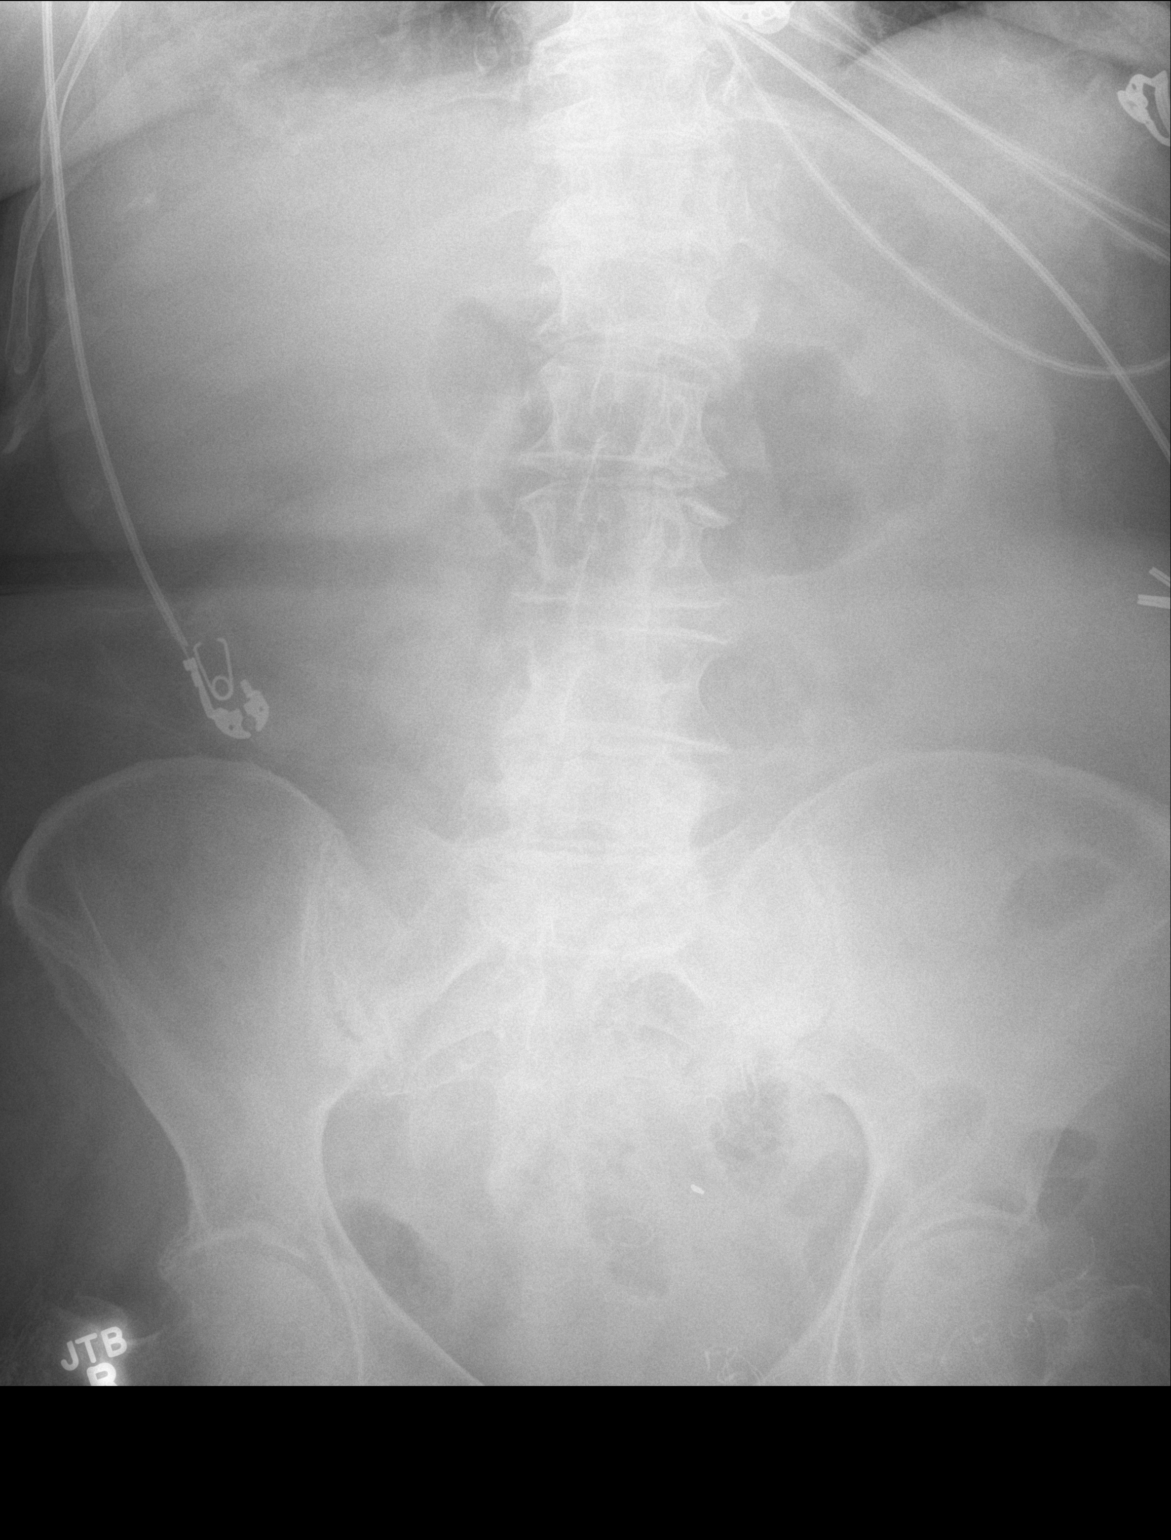

[2 of 2 positions shown; findings below may reference images not displayed]

FINDINGS: The bowel gas pattern is normal. There is no evidence of free air.
No radio-opaque calculi or other significant radiographic
abnormality is seen.
IMPRESSION: Negative exam..

## 2019-02-25 IMAGING — DX DG CHEST 1V PORT
1 series · 1 of 1 positions shown · non-contrast
Comparison: Radiograph October 08, 2017.

CLINICAL DATA: Dyspnea.

EXAM:
PORTABLE CHEST 1 VIEW

[chest ap]
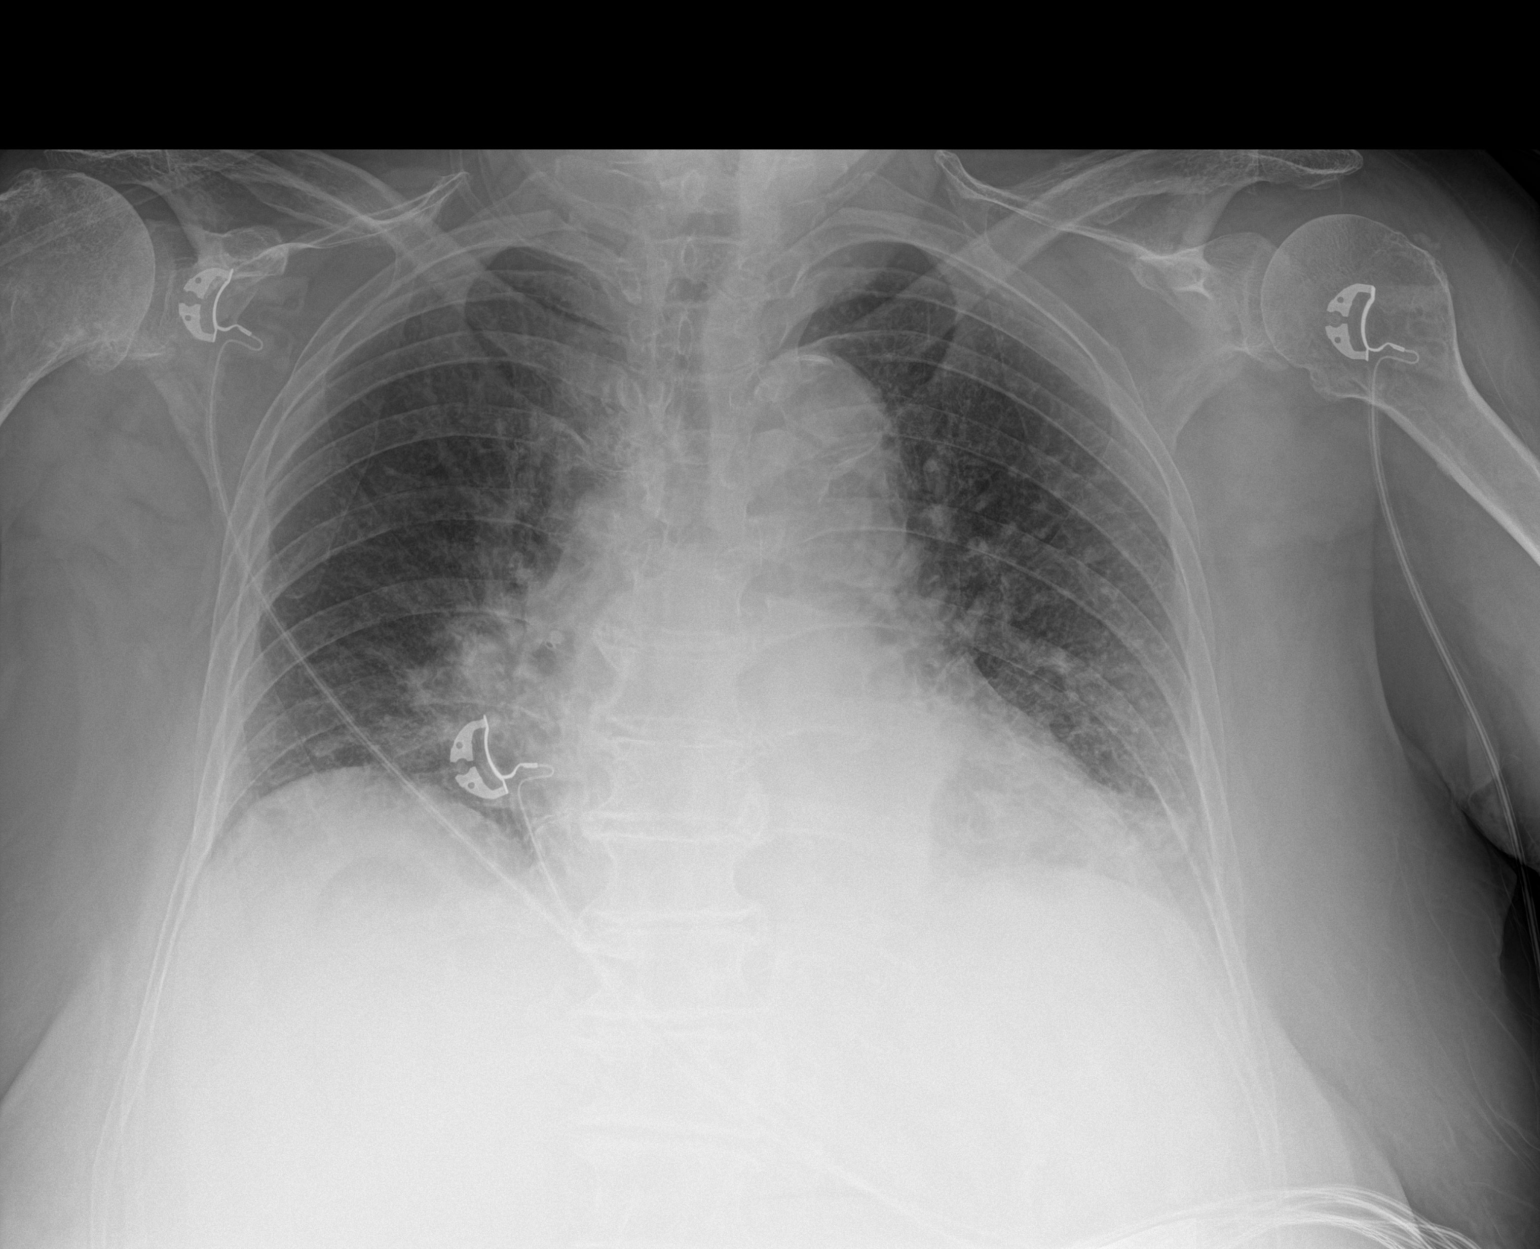

[1 of 1 positions shown; findings below may reference images not displayed]

FINDINGS: Stable cardiomediastinal silhouette. Atherosclerosis of thoracic
aorta is noted. Hypoinflation of the lungs is noted with mild
bibasilar subsegmental atelectasis. Increased bilateral perihilar
densities are noted which may represent edema or crowding of
vascular structures due to hypoinflation. No pneumothorax or pleural
effusion is noted. Bony thorax is unremarkable.
IMPRESSION: Hypoinflation of the lungs is noted with mild bibasilar subsegmental
atelectasis. Increased bilateral perihilar densities are noted which
may represent mild edema or possibly crowding of vascular structures
due to hypoinflation.

Aortic Atherosclerosis ([id number]-[id number]).
